# Patient Record
Sex: Male | Born: 1965 | Hispanic: No | Marital: Married | Smoking: Never smoker
Health system: Southern US, Community
[De-identification: ages and names within clinical notes are randomized; demographics above are authoritative.]

## PROBLEM LIST (undated history)

## (undated) ENCOUNTER — Ambulatory Visit: Disposition: A | Discharge status: Home / Self Care | Payer: Self-pay

## (undated) DIAGNOSIS — I10 Essential (primary) hypertension: Secondary | ICD-10-CM

## (undated) HISTORY — DX: Essential (primary) hypertension: I10

## (undated) HISTORY — PX: NO PAST SURGERIES: SHX2092

---

## 2011-02-08 ENCOUNTER — Ambulatory Visit (INDEPENDENT_AMBULATORY_CARE_PROVIDER_SITE_OTHER): Payer: PRIVATE HEALTH INSURANCE

## 2011-02-08 DIAGNOSIS — J40 Bronchitis, not specified as acute or chronic: Secondary | ICD-10-CM

## 2011-02-08 DIAGNOSIS — R05 Cough: Secondary | ICD-10-CM

## 2011-02-08 DIAGNOSIS — R5383 Other fatigue: Secondary | ICD-10-CM

## 2011-05-07 ENCOUNTER — Other Ambulatory Visit: Payer: Self-pay | Admitting: Family Medicine

## 2011-05-31 ENCOUNTER — Ambulatory Visit (INDEPENDENT_AMBULATORY_CARE_PROVIDER_SITE_OTHER): Payer: PRIVATE HEALTH INSURANCE | Admitting: Physician Assistant

## 2011-05-31 VITALS — BP 156/93 | HR 62 | Temp 98.1°F | Resp 18 | Ht 69.25 in | Wt 180.6 lb

## 2011-05-31 DIAGNOSIS — J309 Allergic rhinitis, unspecified: Secondary | ICD-10-CM | POA: Insufficient documentation

## 2011-05-31 DIAGNOSIS — I10 Essential (primary) hypertension: Secondary | ICD-10-CM | POA: Insufficient documentation

## 2011-05-31 MED ORDER — LOSARTAN POTASSIUM 50 MG PO TABS: 50.0000 mg | ORAL_TABLET | Freq: Every day | ORAL | Status: DC

## 2011-05-31 NOTE — Progress Notes (Signed)
Patient ID: Christopher Case MRN: [id number], DOB: [birth year], 46 y.o. Date of Encounter: 05/31/2011, 6:47 PM  Primary Physician: No primary provider on file.  Chief Complaint: HTN  HPI: 46 y.o. year old male with history below presents for hypertension follow up. Has been out of his Losartan 50 mg for about two months. He was tolerating his medication daily without issue when he did have it. He was not checking it at home however. Today he requests a refill of his Losartan. No CP, HA, vision changes, palpitations, edema, or focal deficits. Eating healthy at home. No time for exercise at outside of work, although he is very active at work. He is otherwise doing well and without issues or complaints today.    Past Medical History  Diagnosis Date  . HTN (hypertension)   . Allergic rhinitis      Home Meds: Prior to Admission medications   Not on File    Allergies: No Known Allergies  History   Social History  . Marital Status: Married    Spouse Name: N/A    Number of Children: N/A  . Years of Education: N/A   Occupational History  . Not on file.   Social History Main Topics  . Smoking status: Never Smoker   . Smokeless tobacco: Not on file  . Alcohol Use: No  . Drug Use: No  . Sexually Active: Yes   Other Topics Concern  . Not on file   Social History Narrative  . No narrative on file     No family history on file.  Review of Systems: Constitutional: negative for chills, fever, night sweats, weight changes, or fatigue  HEENT: negative for vision changes, hearing loss, congestion, rhinorrhea, ST, epistaxis, or sinus pressure Cardiovascular: negative for chest pain, palpitations, or DOE Respiratory: negative for hemoptysis, wheezing, shortness of breath, or cough Abdominal: negative for abdominal pain, nausea, vomiting, diarrhea, or constipation Dermatological: negative for rash Neurologic: negative for headache, dizziness, or syncope All other systems reviewed  and are otherwise negative with the exception to those above and in the HPI.   Physical Exam: Blood pressure 156/93, pulse 62, temperature 98.1 F (36.7 C), temperature source Oral, resp. rate 18, height 5' 9.25" (1.759 m), weight 180 lb 9.6 oz (81.92 kg)., Body mass index is 26.48 kg/(m^2). General: Well developed, well nourished, in no acute distress. Head: Normocephalic, atraumatic, eyes without discharge, sclera non-icteric, nares are without discharge. Bilateral auditory canals clear, TM's are without perforation, pearly Case and translucent with reflective cone of light bilaterally. Oral cavity moist, posterior pharynx without exudate, erythema, peritonsillar abscess, or post nasal drip.  Neck: Supple. No thyromegaly. Full ROM. No lymphadenopathy. No carotid bruits. Lungs: Clear bilaterally to auscultation without wheezes, rales, or rhonchi. Breathing is unlabored. Heart: RRR with S1 S2. No murmurs, rubs, or gallops appreciated.  Msk:  Strength and tone normal for age. Extremities/Skin: Warm and dry. No clubbing or cyanosis. No edema. No rashes or suspicious lesions. Distal pulses 2+ and equal bilaterally. Neuro: Alert and oriented X 3. Moves all extremities spontaneously. Gait is normal. CNII-XII grossly in tact. DTR 2+, cerebellar function intact. Rhomberg normal. Psych:  Responds to questions appropriately with a normal affect.   Labs:  BMP pending  ASSESSMENT AND PLAN:  46 y.o. year old male with HTN -Restart Losartan 50 mg #30 1 po daily RF 4 -Recheck 3 months, before he runs out of medication, to better determine titration of medication -Await labs   Signed, Eula Listen, PA-C  05/31/2011 6:47 PM

## 2011-06-01 LAB — BASIC METABOLIC PANEL
CO2: 24 mEq/L (ref 19–32)
Chloride: 105 mEq/L (ref 96–112)
Potassium: 4.6 mEq/L (ref 3.5–5.3)
Sodium: 135 mEq/L (ref 135–145)

## 2011-09-08 ENCOUNTER — Ambulatory Visit (INDEPENDENT_AMBULATORY_CARE_PROVIDER_SITE_OTHER): Payer: PRIVATE HEALTH INSURANCE | Admitting: Emergency Medicine

## 2011-09-08 VITALS — BP 152/75 | HR 75 | Temp 98.4°F | Resp 16 | Ht 69.0 in | Wt 170.0 lb

## 2011-09-08 DIAGNOSIS — T300 Burn of unspecified body region, unspecified degree: Secondary | ICD-10-CM

## 2011-09-08 DIAGNOSIS — Z23 Encounter for immunization: Secondary | ICD-10-CM

## 2011-09-08 DIAGNOSIS — I10 Essential (primary) hypertension: Secondary | ICD-10-CM

## 2011-09-08 MED ORDER — LOSARTAN POTASSIUM-HCTZ 50-12.5 MG PO TABS: 1.0000 | ORAL_TABLET | Freq: Every day | ORAL | Status: DC

## 2011-09-08 MED ORDER — SILVER SULFADIAZINE 1 % EX CREA: TOPICAL_CREAM | Freq: Every day | CUTANEOUS | Status: DC
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
## 2011-09-08 NOTE — Patient Instructions (Addendum)

## 2012-01-05 ENCOUNTER — Other Ambulatory Visit: Payer: Self-pay | Admitting: Physician Assistant

## 2012-01-07 ENCOUNTER — Ambulatory Visit (INDEPENDENT_AMBULATORY_CARE_PROVIDER_SITE_OTHER): Payer: PRIVATE HEALTH INSURANCE | Admitting: Physician Assistant

## 2012-01-07 VITALS — BP 140/90 | HR 69 | Temp 98.2°F | Resp 18 | Ht 74.0 in | Wt 175.4 lb

## 2012-01-07 DIAGNOSIS — I10 Essential (primary) hypertension: Secondary | ICD-10-CM

## 2012-01-07 NOTE — Progress Notes (Signed)
  Subjective:    Patient ID: Christopher Case, male    DOB: 08/14/1965, 46 y.o.   MRN: [id number]  HPI 46 year old male presents for recheck of blood pressure.  He has been taking Hyzaar 50/12.5 mg daily.  He has been taking daily without any noteable adverse effects.  States blood pressure has continued to be elevated.  No chest pain, SOB, headache, vision changes.  No family history of hypertension.      Review of Systems  All other systems reviewed and are negative.       Objective:   Physical Exam  Constitutional: He is oriented to person, place, and time. He appears well-developed and well-nourished.  HENT:  Head: Normocephalic and atraumatic.  Right Ear: External ear normal.  Left Ear: External ear normal.  Eyes: Conjunctivae normal and EOM are normal.  Neck: Normal range of motion. Neck supple.  Cardiovascular: Normal rate, regular rhythm and normal heart sounds.   Pulmonary/Chest: Effort normal and breath sounds normal.  Musculoskeletal: Normal range of motion.  Neurological: He is alert and oriented to person, place, and time.  Psychiatric: He has a normal mood and affect. His behavior is normal. Judgment and thought content normal.          Assessment & Plan:   1. Hypertension   Increase to Hyzaar 100/25 mg daily Recheck in 4-6 weeks.  Recommend CPE in next 2-3 months

## 2012-01-10 ENCOUNTER — Telehealth: Payer: Self-pay

## 2012-01-10 ENCOUNTER — Other Ambulatory Visit: Payer: Self-pay | Admitting: Physician Assistant

## 2012-01-10 MED ORDER — LOSARTAN POTASSIUM-HCTZ 100-25 MG PO TABS: 1.0000 | ORAL_TABLET | Freq: Every day | ORAL | Status: DC

## 2012-01-10 NOTE — Telephone Encounter (Signed)
Patient states prescriptions were never sent to the pharmacy from when he was seen Monday.  [id number] or [phone]

## 2012-01-10 NOTE — Telephone Encounter (Signed)
Pt's wife called stated pt seen on Monday and pt's prescription for BP has not been called into pharmacy yet.  Please let them know when done. [facility] [address]

## 2012-01-11 NOTE — Telephone Encounter (Signed)
I sent to pharmacy yesterday/ called to advise.

## 2012-04-07 ENCOUNTER — Other Ambulatory Visit: Payer: Self-pay | Admitting: Physician Assistant

## 2012-06-08 ENCOUNTER — Other Ambulatory Visit: Payer: Self-pay | Admitting: Physician Assistant

## 2012-06-10 ENCOUNTER — Ambulatory Visit (INDEPENDENT_AMBULATORY_CARE_PROVIDER_SITE_OTHER): Payer: PRIVATE HEALTH INSURANCE | Admitting: Family Medicine

## 2012-06-10 VITALS — BP 131/79 | HR 70 | Temp 98.5°F | Resp 17 | Ht 70.5 in | Wt 181.0 lb

## 2012-06-10 DIAGNOSIS — I10 Essential (primary) hypertension: Secondary | ICD-10-CM

## 2012-06-10 MED ORDER — LOSARTAN POTASSIUM-HCTZ 100-25 MG PO TABS: 1.0000 | ORAL_TABLET | Freq: Every day | ORAL | Status: DC

## 2012-06-10 NOTE — Patient Instructions (Addendum)
Thank you for coming in today. Take your medications daily We will call if your labs are abnormal.  Return in one year or sooner for followup.

## 2012-06-10 NOTE — Progress Notes (Signed)
Christopher Case is a 47 y.o. male who presents to [facility] today for hypertension followup.  Patient is previously well controlled with losartan hydrochlorothiazide. He is doing well and is currently asymptomatic with his hypertension. He denies any chest pains palpitations shortness of breath or syncope. He feels well and is able to go to work and lead a normal life.    PMH: Reviewed htn  History  Substance Use Topics  . Smoking status: Never Smoker   . Smokeless tobacco: Never Used  . Alcohol Use: No   ROS as above  Medications reviewed. Current Outpatient Prescriptions  Medication Sig Dispense Refill  . losartan-hydrochlorothiazide (HYZAAR) 100-25 MG per tablet Take 1 tablet by mouth daily.  90 tablet  3   No current facility-administered medications for this visit.    Exam:  BP 131/79  Pulse 70  Temp(Src) 98.5 F (36.9 C) (Oral)  Resp 17  Ht 5' 10.5" (1.791 m)  Wt 181 lb (82.101 kg)  BMI 25.6 kg/m2  SpO2 99% Gen: Well NAD HEENT: EOMI,  MMM Lungs: CTABL Nl WOB Heart: RRR no MRG Abd: NABS, NT, ND Exts: Non edematous BL  LE, warm and well perfused.   No results found for this or any previous visit (from the past 72 hour(s)).  Assessment and Plan: 47 y.o. male with hypertension currently well-controlled. Plan to refill with certain hydrochlorothiazide. Additionally we'll obtain a comprehensive metabolic panel and direct LDL. Patient will followup in one year for refills.

## 2012-06-11 LAB — LDL CHOLESTEROL, DIRECT: Direct LDL: 110 mg/dL — ABNORMAL HIGH

## 2012-06-11 LAB — COMPLETE METABOLIC PANEL WITH GFR
Alkaline Phosphatase: 72 U/L (ref 39–117)
BUN: 17 mg/dL (ref 6–23)
CO2: 26 mEq/L (ref 19–32)
Creat: 1.06 mg/dL (ref 0.50–1.35)
GFR, Est African American: >89 mL/min
GFR, Est Non African American: 84 mL/min
Glucose, Bld: 77 mg/dL (ref 70–99)
Total Bilirubin: 0.5 mg/dL (ref 0.3–1.2)

## 2013-09-02 ENCOUNTER — Ambulatory Visit (INDEPENDENT_AMBULATORY_CARE_PROVIDER_SITE_OTHER): Payer: PRIVATE HEALTH INSURANCE | Admitting: Family Medicine

## 2013-09-02 VITALS — BP 144/76 | HR 66 | Temp 98.1°F | Resp 16 | Ht 70.5 in | Wt 177.8 lb

## 2013-09-02 DIAGNOSIS — I10 Essential (primary) hypertension: Secondary | ICD-10-CM

## 2013-09-02 MED ORDER — LOSARTAN POTASSIUM-HCTZ 100-25 MG PO TABS: 1.0000 | ORAL_TABLET | Freq: Every day | ORAL | Status: DC

## 2013-09-02 NOTE — Patient Instructions (Addendum)
Work on diet and exercise  Breathing exercises

## 2013-09-02 NOTE — Progress Notes (Signed)
  Christopher Case - 48 y.o. male MRN [id number]  Date of birth: 1965/09/08  SUBJECTIVE:  Including CC & ROS.   Patient is a 48 year old male who presents today for followup on his hypertension. According to the patient's record he has been on medication since 2013. And has consistently been taking losartan 100 mg HCTZ 25 mg for the past 2 years. He did have some clinical improvement from systolics in the 170s down the 140s previously but  has not made progress in the lower than 140. For the past month patient been off medication. He denies any changes in diet has been watching his salt content eats most of his meals at home. He does not perform any exercise other than walking at work. Patient's had a normal healthy weight for his height. Denies any chest pain, shortness of breath, syncope or dizziness. Denies tobacco use, denies alcohol use.  ROS:  Constitutional:  No  fatigue.  Respiratory:  No shortness of breath, cough, or wheezing Cardiovascular:  No palpitations, chest pain or syncope Review of systems otherwise negative except for what is stated in HPI  HISTORY: Past Medical, Surgical, Social, and Family History Reviewed & Updated per EMR. Pertinent Historical Findings include: Nonsmoker, no alcohol use, family history is significant for HTN  PHYSICAL EXAM:  VS: BP:144/76 mmHg  HR:66bpm  TEMP:98.1 F (36.7 C)(Oral)  RESP:98 %  HT:5' 10.5" (179.1 cm)   WT:177 lb 12.8 oz (80.65 kg)  BMI:25.2 PHYSICAL EXAM: General:  Alert and oriented, No acute distress.   HENT:  Normocephalic, Oral mucosa is moist.   Respiratory:  Lungs are clear to auscultation, Respirations are non-labored, Symmetrical chest wall expansion.   Cardiovascular:  Normal rate, Regular rhythm, No murmur, Good pulses equal in all extremities, No edema.   Gastrointestinal:  Soft, Non-tender, Non-distended, Normal bowel sounds, No organomegaly.   Integumentary:  Warm, Dry, No rash.   Neurologic:  Alert, Oriented, No focal  defects Psychiatric:  Cooperative, Appropriate mood & affect.    ASSESSMENT & PLAN:  Essential hypertension: Patient has had elevated blood pressures persistency for the past 2 years. He's consistently been on losartan 100 mg/HCTZ 25 mg for the past 2 years with occasionally missing dose from running out of medication. Although his blood pressure-improved the 140s he has not made much progress since.  According to the patient his lifestyle modification include low-salt diet and exercise. Advised patient to continue to work on these as well as stress management with breathing exercises and meditation. Plan continue current dose of this medication recommend followup in office in 2 months to further discuss possible additional medication. Would recommend trying adding amlodipine 5 mg or 10mg . We'll evaluating patient with a BMP today to rule out any changes in kidney function LDL in 2014 : 110 Lab Results  Component Value Date   CREATININE 1.06 06/10/2012   CREATININE 1.04 05/31/2011

## 2013-09-03 LAB — BASIC METABOLIC PANEL
BUN: 17 mg/dL (ref 6–23)
CALCIUM: 9.3 mg/dL (ref 8.4–10.5)
CO2: 26 meq/L (ref 19–32)
CREATININE: 1.1 mg/dL (ref 0.50–1.35)
Chloride: 105 mEq/L (ref 96–112)
GLUCOSE: 94 mg/dL (ref 70–99)
Potassium: 4.5 mEq/L (ref 3.5–5.3)
SODIUM: 140 meq/L (ref 135–145)

## 2013-09-03 NOTE — Progress Notes (Signed)
Tried to leave message for patient to schedule appt with Dr. Neva SeatGreene for 2 month f/up but VM was not working properly.

## 2013-09-04 NOTE — Progress Notes (Signed)
Ppatient discussed with Dr. Tammy Soursidiano. Agree with assessment and plan of care per her note.

## 2013-09-08 ENCOUNTER — Encounter: Payer: Self-pay | Admitting: Radiology

## 2013-09-15 NOTE — Progress Notes (Signed)
Left a message for patient to return call to schedule two month follow up appointment with Dr. Neva SeatGreene for hypertension.

## 2013-09-17 NOTE — Progress Notes (Signed)
Left a message for patient to return call to schedule a 2 month follow up visit with Dr. Neva SeatGreene for hypertension.

## 2013-09-25 NOTE — Progress Notes (Signed)
Appointment scheduled for 10/19 at 345 with Dr. Neva SeatGreene

## 2013-11-23 ENCOUNTER — Ambulatory Visit: Payer: Self-pay | Admitting: Family Medicine

## 2014-03-11 ENCOUNTER — Ambulatory Visit (INDEPENDENT_AMBULATORY_CARE_PROVIDER_SITE_OTHER): Payer: PRIVATE HEALTH INSURANCE | Admitting: Sports Medicine

## 2014-03-11 VITALS — BP 142/68 | HR 77 | Temp 98.6°F | Resp 16 | Ht 70.0 in | Wt 177.0 lb

## 2014-03-11 DIAGNOSIS — I10 Essential (primary) hypertension: Secondary | ICD-10-CM

## 2014-03-11 MED ORDER — HYDROCHLOROTHIAZIDE 50 MG PO TABS: 50.0000 mg | ORAL_TABLET | Freq: Every day | ORAL | Status: DC

## 2014-03-11 MED ORDER — LOSARTAN POTASSIUM 100 MG PO TABS: 100.0000 mg | ORAL_TABLET | Freq: Every day | ORAL | Status: DC

## 2014-03-11 NOTE — Progress Notes (Signed)
History and physical examinations reviewed in detail. Agree with assessment and plan as outlined by Dr. Didiano. Fermon Ureta Martin Lottie Sigman, M.D. [facility]  [place] [address] [place], [place]  [address] [phone] phone [phone] fax  

## 2014-03-11 NOTE — Addendum Note (Signed)
Addended by: Ethelda ChickSMITH, Filipe Greathouse M on: 03/11/2014 11:32 PM   Modules accepted: Level of Service

## 2014-03-11 NOTE — Progress Notes (Signed)
  Christopher Case - 49 y.o. male MRN [id number]  Date of birth: 15-May-1965  SUBJECTIVE:  Including CC & ROS.   Patient is a [age] year old male who presents today for followup on his hypertension. According to the patient's record he has been on medication since 2013. And has consistently been taking losartan 100 mg HCTZ 25 mg for the past 2 years. Home BP in 140s over 60s. For the past month patient been off medication after running out. He denies any changes in diet has been watching his salt content eats most of his meals at home. He does not perform any exercise other than walking at work. Patient's had a normal healthy weight for his height. Denies any chest pain, shortness of breath, syncope or dizziness. Denies tobacco use, denies alcohol use.  ROS:  Constitutional:  No  fatigue.  Respiratory:  No shortness of breath, cough, or wheezing Cardiovascular:  No palpitations, chest pain or syncope Review of systems otherwise negative except for what is stated in HPI  HISTORY: Past Medical, Surgical, Social, and Family History Reviewed & Updated per EMR. Pertinent Historical Findings include: Nonsmoker, no alcohol use, family history is significant for HTN  PHYSICAL EXAM:  VS: BP:(!) 142/68 mmHg  HR:77bpm  TEMP:98.6 F (37 C)(Oral)  RESP:98 %  HT:5\' 10"  (177.8 cm)   WT:177 lb (80.287 kg)  BMI:25.4 PHYSICAL EXAM: General:  Alert and oriented, No acute distress.   HENT:  Normocephalic, Oral mucosa is moist.   Respiratory:  Lungs are clear to auscultation, Respirations are non-labored, Symmetrical chest wall expansion.   Cardiovascular:  Normal rate, Regular rhythm, No murmur, Good pulses equal in all extremities, No edema.   Gastrointestinal:  Soft, Non-tender, Non-distended, Normal bowel sounds, No organomegaly.   Integumentary:  Warm, Dry, No rash.   Neurologic:  Alert, Oriented, No focal defects Psychiatric:  Cooperative, Appropriate mood & affect.    ASSESSMENT & PLAN:  Essential  hypertension: Patient presents today for follow-up on essential hypertension. Continues to have elevated blood pressures in the 140s over 60s to 70s. Recommend small titration up on his current blood pressure medication. Patient was agreeable this plan. Patient sent a new prescription for losartan 100 mg to be taken daily as well as HCTZ 50 mg to be taken daily. Lab work will be checked in 2 weeks to determine his kidney function is tolerating changes medication.  Advised patient to follow-up in 2 weeks for lab work and blood pressure check. Lab work is artery been ordered in future date.  Lab Results  Component Value Date   CREATININE 1.10 09/02/2013   CREATININE 1.06 06/10/2012   CREATININE 1.04 05/31/2011

## 2014-12-21 ENCOUNTER — Other Ambulatory Visit: Payer: Self-pay | Admitting: Sports Medicine

## 2015-03-25 ENCOUNTER — Ambulatory Visit (INDEPENDENT_AMBULATORY_CARE_PROVIDER_SITE_OTHER): Payer: PRIVATE HEALTH INSURANCE | Admitting: Physician Assistant

## 2015-03-25 VITALS — BP 130/82 | HR 63 | Temp 98.1°F | Resp 18 | Ht 70.0 in | Wt 179.4 lb

## 2015-03-25 DIAGNOSIS — I10 Essential (primary) hypertension: Secondary | ICD-10-CM | POA: Diagnosis not present

## 2015-03-25 DIAGNOSIS — R21 Rash and other nonspecific skin eruption: Secondary | ICD-10-CM

## 2015-03-25 LAB — BASIC METABOLIC PANEL
BUN: 19 mg/dL (ref 7–25)
CO2: 24 mmol/L (ref 20–31)
CREATININE: 0.95 mg/dL (ref 0.60–1.35)
Calcium: 8.7 mg/dL (ref 8.6–10.3)
Chloride: 107 mmol/L (ref 98–110)
Glucose, Bld: 89 mg/dL (ref 65–99)
Potassium: 4.2 mmol/L (ref 3.5–5.3)
Sodium: 138 mmol/L (ref 135–146)

## 2015-03-25 MED ORDER — LOSARTAN POTASSIUM 100 MG PO TABS: 100.0000 mg | ORAL_TABLET | Freq: Every day | ORAL | Status: DC

## 2015-03-25 MED ORDER — HYDROCORTISONE 2.5 % EX CREA: TOPICAL_CREAM | Freq: Two times a day (BID) | CUTANEOUS | Status: DC

## 2015-03-25 MED ORDER — HYDROCHLOROTHIAZIDE 50 MG PO TABS: 50.0000 mg | ORAL_TABLET | Freq: Every day | ORAL | Status: DC

## 2015-03-25 MED ORDER — HYDROXYZINE HCL 25 MG PO TABS: 12.5000 mg | ORAL_TABLET | Freq: Three times a day (TID) | ORAL | Status: DC | PRN

## 2015-03-25 NOTE — Progress Notes (Signed)
   Subjective:    Patient ID: Christopher Case, male    DOB: 08/11/1965, 50 y.o.   MRN: [id number]  Chief Complaint  Patient presents with  . Blood Pressure Check  . Rash    Itchy, arms and legs  . Medication Refill    hctz   Medications, allergies, past medical history, surgical history, family history, social history and problem list reviewed and updated.  HPI  50 yom. Needing bp meds refilled, concerned about bp as ran out of hctz last week.   Hx htn. Has been on hctz and losartan stable doses for several years. Checks bp occasionally, runs <140/80. Denies cp or sob. Does not do formal exercise but very active at work.   Takes meds as prescribed. Ran out of hctz last week so concerned bp might be high.   Also mentioning intermittent diffuse itchy bumps that come on for several days at a time. Past few months. Few currently. Thinks he is allergic to certain foods but unsure what at this point.   Review of Systems No fevers, chills. No abd pain, n/v, diarrhea.     Objective:   Physical Exam  Constitutional: He appears well-developed and well-nourished.  Non-toxic appearance. He does not have a sickly appearance. He does not appear ill. No distress.  BP 130/82 mmHg  Pulse 63  Temp(Src) 98.1 F (36.7 C) (Oral)  Resp 18  Ht  (1.778 m)  Wt 179 lb 6.4 oz (81.375 kg)  BMI 25.74 kg/m2  SpO2 98%   Cardiovascular: Normal rate, regular rhythm and normal heart sounds.   Pulmonary/Chest: Effort normal and breath sounds normal. No tachypnea.  Skin:  Occasional scattered papules on chest, bilateral arms. No underlying erythema. No tracking. No purulence or induration.       Assessment & Plan:   Essential hypertension - Plan: hydrochlorothiazide (HYDRODIURIL) 50 MG tablet, losartan (COZAAR) 100 MG tablet, Basic metabolic panel  Rash and nonspecific skin eruption - Plan: hydrOXYzine (ATARAX/VISTARIL) 25 MG tablet, hydrocortisone 2.5 % cream --bp stable today, refilled meds one  year, checking bmp today --encouraged to establish with pcp and get complete physical in one year at time of refill --hydroxyzine, steroid cream for intermittent dermatitis  Donnajean Lopes, PA-C Physician Assistant-Certified [facility]  03/26/2015 5:59 AM

## 2015-03-25 NOTE — Patient Instructions (Signed)
I've refilled the losartan and hctz for one year.  Continue to work on limiting the sodium and sugar intake like you are. We're checking your kidneys today to ensure they are ok with the medicines and high blood pressure.  I recommend you get a PCP here and get a yearly physical the next time you get your medicines refilled. For the rash and itching, apply the topical and take the medicine as needed.   Hypertension Hypertension, commonly called high blood pressure, is when the force of blood pumping through your arteries is too strong. Your arteries are the blood vessels that carry blood from your heart throughout your body. A blood pressure reading consists of a higher number over a lower number, such as 110/72. The higher number (systolic) is the pressure inside your arteries when your heart pumps. The lower number (diastolic) is the pressure inside your arteries when your heart relaxes. Ideally you want your blood pressure below 120/80. Hypertension forces your heart to work harder to pump blood. Your arteries may become narrow or stiff. Having untreated or uncontrolled hypertension can cause heart attack, stroke, kidney disease, and other problems. RISK FACTORS Some risk factors for high blood pressure are controllable. Others are not.  Risk factors you cannot control include:   Race. You may be at higher risk if you are African American.  Age. Risk increases with age.  Gender. Men are at higher risk than women before age 46 years. After age 61, women are at higher risk than men. Risk factors you can control include:  Not getting enough exercise or physical activity.  Being overweight.  Getting too much fat, sugar, calories, or salt in your diet.  Drinking too much alcohol. SIGNS AND SYMPTOMS Hypertension does not usually cause signs or symptoms. Extremely high blood pressure (hypertensive crisis) may cause headache, anxiety, shortness of breath, and nosebleed. DIAGNOSIS To check if  you have hypertension, your health care provider will measure your blood pressure while you are seated, with your arm held at the level of your heart. It should be measured at least twice using the same arm. Certain conditions can cause a difference in blood pressure between your right and left arms. A blood pressure reading that is higher than normal on one occasion does not mean that you need treatment. If it is not clear whether you have high blood pressure, you may be asked to return on a different day to have your blood pressure checked again. Or, you may be asked to monitor your blood pressure at home for 1 or more weeks. TREATMENT Treating high blood pressure includes making lifestyle changes and possibly taking medicine. Living a healthy lifestyle can help lower high blood pressure. You may need to change some of your habits. Lifestyle changes may include:  Following the DASH diet. This diet is high in fruits, vegetables, and whole grains. It is low in salt, red meat, and added sugars.  Keep your sodium intake below 2,300 mg per day.  Getting at least 30-45 minutes of aerobic exercise at least 4 times per week.  Losing weight if necessary.  Not smoking.  Limiting alcoholic beverages.  Learning ways to reduce stress. Your health care provider may prescribe medicine if lifestyle changes are not enough to get your blood pressure under control, and if one of the following is true:  You are 68-75 years of age and your systolic blood pressure is above 140.  You are 1 years of age or older, and your systolic  blood pressure is above 150.  Your diastolic blood pressure is above 90.  You have diabetes, and your systolic blood pressure is over 140 or your diastolic blood pressure is over 90.  You have kidney disease and your blood pressure is above 140/90.  You have heart disease and your blood pressure is above 140/90. Your personal target blood pressure may vary depending on your  medical conditions, your age, and other factors. HOME CARE INSTRUCTIONS  Have your blood pressure rechecked as directed by your health care provider.   Take medicines only as directed by your health care provider. Follow the directions carefully. Blood pressure medicines must be taken as prescribed. The medicine does not work as well when you skip doses. Skipping doses also puts you at risk for problems.  Do not smoke.   Monitor your blood pressure at home as directed by your health care provider. SEEK MEDICAL CARE IF:   You think you are having a reaction to medicines taken.  You have recurrent headaches or feel dizzy.  You have swelling in your ankles.  You have trouble with your vision. SEEK IMMEDIATE MEDICAL CARE IF:  You develop a severe headache or confusion.  You have unusual weakness, numbness, or feel faint.  You have severe chest or abdominal pain.  You vomit repeatedly.  You have trouble breathing. MAKE SURE YOU:   Understand these instructions.  Will watch your condition.  Will get help right away if you are not doing well or get worse.   This information is not intended to replace advice given to you by your health care provider. Make sure you discuss any questions you have with your health care provider.   Document Released: 01/22/2005 Document Revised: 06/08/2014 Document Reviewed: 11/14/2012 Elsevier Interactive Patient Education [employer]

## 2015-03-26 ENCOUNTER — Encounter: Payer: Self-pay | Admitting: Physician Assistant

## 2015-05-13 ENCOUNTER — Encounter: Payer: Self-pay | Admitting: Allergy and Immunology

## 2015-05-13 ENCOUNTER — Ambulatory Visit (INDEPENDENT_AMBULATORY_CARE_PROVIDER_SITE_OTHER): Payer: PRIVATE HEALTH INSURANCE | Admitting: Allergy and Immunology

## 2015-05-13 VITALS — BP 140/80 | HR 75 | Temp 98.1°F | Resp 17 | Ht 69.69 in | Wt 178.6 lb

## 2015-05-13 DIAGNOSIS — R05 Cough: Secondary | ICD-10-CM | POA: Diagnosis not present

## 2015-05-13 DIAGNOSIS — J309 Allergic rhinitis, unspecified: Secondary | ICD-10-CM

## 2015-05-13 DIAGNOSIS — H1010 Acute atopic conjunctivitis, unspecified eye: Secondary | ICD-10-CM

## 2015-05-13 DIAGNOSIS — R059 Cough, unspecified: Secondary | ICD-10-CM

## 2015-05-13 MED ORDER — BEPOTASTINE BESILATE 1.5 % OP SOLN: OPHTHALMIC | Status: DC

## 2015-05-13 MED ORDER — AZELASTINE-FLUTICASONE 137-50 MCG/ACT NA SUSP: NASAL | Status: DC

## 2015-05-13 MED ORDER — MONTELUKAST SODIUM 10 MG PO TABS: ORAL_TABLET | ORAL | Status: DC

## 2015-05-13 NOTE — Progress Notes (Signed)
FOLLOW UP NOTE  RE: Marino Rogerson MRN: [id number] DOB: 05/24/1965 [facility] [address] [place] [address] Date of Office Visit: 05/13/2015  Subjective:  Christopher Case is a 50 y.o. male who presents today for Nasal Congestion and Pressure Behind the Eyes  Assessment:   1. Allergic rhinoconjunctivitis   2. Cough    Plan:   Meds ordered this encounter  Medications  . Azelastine-Fluticasone 137-50 MCG/ACT SUSP    Sig: Use one spray in each nostril twice daily.    Dispense:  1 Bottle    Refill:  5  . Bepotastine Besilate (BEPREVE) 1.5 % SOLN    Sig: Apply one drop in each eye twice daily as needed for itchy eyes.    Dispense:  1 Bottle    Refill:  5  . montelukast (SINGULAIR) 10 MG tablet    Sig: Take one tablet daily in the evening to prevent cough or wheeze.    Dispense:  30 tablet    Refill:  5   1. Avoidance: Pollen--reviewed various modalities for minimizing exposure etc. 2. Antihistamine: Zyrtec   by mouth once daily for runny nose or itching. 3. Nasal Spray: Dymista one spray(s) each nostril twice daily for stuffy nose or drainage.  4. Eye Drops: Bepreve one drop(s) each eye twice daily for itchy eyes. 5. Other: Restart Singulair  each evening. 6. Nasal Saline wash each evening at shower time. 7. Follow up Visit: 2-3 months or sooner if needed.  HPI: Christopher Case returns to the office with one week history of recurring symptoms though he has not been seen since April 2015.  He had decided to discontinue immunotherapy.  His recent symptoms include congestion, sneezing, itchy watery eyes, intermittent postnasal drip, and his wife reports noted mouth breathing during sleep.  He does not snore and denies any difficulty breathing, shortness of breath, sore throat, discolored drainage, headache or fever.  Currently he feels the cough is rare and mostly associated with postnasal drip.  He is requesting medication refills as they  previously have been beneficial.  Denies ED or urgent care visits, prednisone or antibiotic courses. Reports sleep and activity are normal.  No other new medical medical problems.  Continues to follow his primary regarding blood pressure and had seen dermatology related to hand eczema.  [facility] has a current medication list which includes the following prescription(s): cetirizine, hydrochlorothiazide, losartan.  Drug Allergies: No Known Allergies  Objective:   Filed Vitals:   05/13/15 1027  BP: 140/80  Pulse: 75  Temp: 98.1 F (36.7 C)  Resp: 17   SpO2 Readings from Last 1 Encounters:  05/13/15 98%   Physical Exam  Constitutional: He is well-developed, well-nourished, and in no distress.  Intermittent sniffling.  HENT:  Head: Atraumatic.  Right Ear: Tympanic membrane and ear canal normal.  Left Ear: Tympanic membrane and ear canal normal.  Nose: Mucosal edema present. No rhinorrhea. No epistaxis.  Mouth/Throat: Oropharynx is clear and moist and mucous membranes are normal. No oropharyngeal exudate, posterior oropharyngeal edema or posterior oropharyngeal erythema.  Eyes: Conjunctivae are normal.  Neck: Neck supple.  Cardiovascular: Normal rate, S1 normal and S2 normal.   No murmur heard. Pulmonary/Chest: Effort normal and breath sounds normal. He has no wheezes. He has no rhonchi. He has no rales.  Lymphadenopathy:    He has no cervical adenopathy.  Skin: Skin is warm and intact. No rash noted. No cyanosis. Nails show no clubbing.   Diagnostics: Spirometry:  FVC 4.72--104%, FEV1 3.67--99%.    Joane Postel M. Willa RoughHicks, MD  cc: No PCP Per Patient

## 2015-05-13 NOTE — Patient Instructions (Addendum)
  Take Home Sheet  1. Avoidance: Pollen   2. Antihistamine: Zyrtec 10mg   by mouth once daily for runny nose or itching.   3. Nasal Spray: Dymista one spray(s) each nostril twice daily for stuffy nose or drainage.     4. Eye Drops: Bepreve one drop(s) each eye twice daily for itchy eyes.   5. Other: Restart Singulair 10mg  each evening.   6. Nasal Saline wash each evening at shower time.   7. Follow up Visit: 2-3 months or sooner if needed.   Websites that have reliable Patient information: 1. American Academy of Asthma, Allergy, & Immunology: www.aaaai.org 2. Food Allergy Network: www.foodallergy.org 3. Mothers of Asthmatics: www.aanma.org 4. [facility]: https://www.strong.com/www.njc.org 5. American College of Allergy, Asthma, & Immunology: BiggerRewards.iswww.allergy.[employer].edu or www.acaai.org

## 2016-03-16 ENCOUNTER — Ambulatory Visit (INDEPENDENT_AMBULATORY_CARE_PROVIDER_SITE_OTHER): Payer: PRIVATE HEALTH INSURANCE | Admitting: Emergency Medicine

## 2016-03-16 ENCOUNTER — Ambulatory Visit (INDEPENDENT_AMBULATORY_CARE_PROVIDER_SITE_OTHER): Payer: PRIVATE HEALTH INSURANCE

## 2016-03-16 VITALS — BP 144/66 | HR 80 | Temp 98.1°F | Ht 69.69 in | Wt 184.8 lb

## 2016-03-16 DIAGNOSIS — S20211A Contusion of right front wall of thorax, initial encounter: Secondary | ICD-10-CM

## 2016-03-16 DIAGNOSIS — R0789 Other chest pain: Secondary | ICD-10-CM

## 2016-03-16 MED ORDER — HYDROCODONE-ACETAMINOPHEN 5-325 MG PO TABS: 1.0000 | ORAL_TABLET | Freq: Four times a day (QID) | ORAL | 0 refills | Status: DC | PRN

## 2016-03-16 NOTE — Progress Notes (Signed)
Christopher Case 51 y.o.   Chief Complaint  Patient presents with  . Chest Pain    X 1 week right side chest pain due to fall  . Shortness of Breath    X 1 week    HISTORY OF PRESENT ILLNESS: This is a 51 y.o. male complaining of right sided chest wall pain following injury 1 week ago. Also has intermittent SOB. HPI   Prior to Admission medications   Medication Sig Start Date End Date Taking? Authorizing Provider  cetirizine (ZYRTEC) 10 MG tablet Take 10 mg by mouth daily.   Yes Historical Provider, MD  losartan (COZAAR) 100 MG tablet Take 1 tablet (100 mg total) by mouth daily. 03/25/15  Yes Todd McVeigh, PA  montelukast (SINGULAIR) 10 MG tablet Take one tablet daily in the evening to prevent cough or wheeze. 05/13/15  Yes Roselyn Kara MeadM Hicks, MD  Azelastine-Fluticasone 137-50 MCG/ACT SUSP Use one spray in each nostril twice daily. Patient not taking: Reported on 03/16/2016 05/13/15   Baxter Hireoselyn M Hicks, MD  Bepotastine Besilate (BEPREVE) 1.5 % SOLN Apply one drop in each eye twice daily as needed for itchy eyes. Patient not taking: Reported on 03/16/2016 05/13/15   Baxter Hireoselyn M Hicks, MD  hydrochlorothiazide (HYDRODIURIL) 50 MG tablet Take 1 tablet (50 mg total) by mouth daily. Patient not taking: Reported on 03/16/2016 03/25/15   Raelyn Ensignodd McVeigh, PA  HYDROcodone-acetaminophen (NORCO) 5-325 MG tablet Take 1 tablet by mouth every 6 (six) hours as needed for moderate pain. 03/16/16   Georgina QuintMiguel Jose Deandre Brannan, MD  hydrocortisone 2.5 % cream Apply topically 2 (two) times daily. Patient not taking: Reported on 05/13/2015 03/25/15   Raelyn Ensignodd McVeigh, PA  hydrOXYzine (ATARAX/VISTARIL) 25 MG tablet Take 0.5-1 tablets (12.5-25 mg total) by mouth every 8 (eight) hours as needed for itching. Patient not taking: Reported on 05/13/2015 03/25/15   Raelyn Ensignodd McVeigh, PA    No Known Allergies  Patient Active Problem List   Diagnosis Date Noted  . HTN (hypertension)   . Allergic rhinitis     Past Medical History:  Diagnosis Date  .  Allergic rhinitis   . HTN (hypertension)     Past Surgical History:  Procedure Laterality Date  . NO PAST SURGERIES      Social History   Social History  . Marital status: Married    Spouse name: N/A  . Number of children: N/A  . Years of education: N/A   Occupational History  . Not on file.   Social History Main Topics  . Smoking status: Never Smoker  . Smokeless tobacco: Never Used  . Alcohol use No  . Drug use: No  . Sexual activity: Yes   Other Topics Concern  . Not on file   Social History Narrative  . No narrative on file    Family History  Problem Relation Age of Onset  . Hypertension Father   . Allergic rhinitis Neg Hx   . Angioedema Neg Hx   . Asthma Neg Hx   . Atopy Neg Hx   . Eczema Neg Hx   . Immunodeficiency Neg Hx   . Urticaria Neg Hx      Review of Systems  Constitutional: Negative for chills and fever.  HENT: Negative for congestion, nosebleeds and sore throat.   Respiratory: Positive for shortness of breath. Negative for cough.   Cardiovascular: Positive for chest pain. Negative for palpitations and leg swelling.  Gastrointestinal: Negative for abdominal pain, nausea and vomiting.  Genitourinary: Negative for hematuria.  Skin: Negative for rash.  Neurological: Negative for dizziness and headaches.  Endo/Heme/Allergies: Does not bruise/bleed easily.  All other systems reviewed and are negative.  Vitals:   03/16/16 1042  BP: (!) 144/66  Pulse: 80  Temp: 98.1 F (36.7 C)    Physical Exam  Constitutional: He is oriented to person, place, and time. He appears well-developed and well-nourished.  HENT:  Head: Normocephalic and atraumatic.  Eyes: Conjunctivae and EOM are normal. Pupils are equal, round, and reactive to light.  Neck: Normal range of motion. Neck supple.  Cardiovascular: Normal rate and regular rhythm.   Pulmonary/Chest: Effort normal and breath sounds normal. He exhibits tenderness (right lower anterio-lateral  ribcage).  Abdominal: Soft. He exhibits no distension. There is no tenderness.  Musculoskeletal: Normal range of motion.  Neurological: He is alert and oriented to person, place, and time. No sensory deficit. He exhibits normal muscle tone.  Skin: Skin is warm and dry. No rash noted.  No bruising  Vitals reviewed.  CXR reviewed: NAD, no PTX. ASSESSMENT & PLAN: Christopher Case was seen today for chest pain and shortness of breath.  Diagnoses and all orders for this visit:  Rib contusion, right, initial encounter -     DG Ribs Unilateral W/Chest Right; Future  Chest wall pain -     DG Ribs Unilateral W/Chest Right; Future  Other orders -     HYDROcodone-acetaminophen (NORCO) 5-325 MG tablet; Take 1 tablet by mouth every 6 (six) hours as needed for moderate pain.    Patient Instructions       IF you received an x-ray today, you will receive an invoice from [facility]. Please contact [facility] at [phone] with questions or concerns regarding your invoice.   IF you received labwork today, you will receive an invoice from [facility]. Please contact [facility] at [phone] with questions or concerns regarding your invoice.   Our billing staff will not be able to assist you with questions regarding bills from these companies.  You will be contacted with the lab results as soon as they are available. The fastest way to get your results is to activate your My Chart account. Instructions are located on the last page of this paperwork. If you have not heard from [place] regarding the results in 2 weeks, please contact this office.     Rib Contusion Introduction A rib contusion is a deep bruise on your rib area. Contusions are the result of a blunt trauma that causes bleeding and injury to the tissues under the skin. A rib contusion may involve bruising of the ribs and of the skin and muscles in the area. The skin overlying the contusion may turn blue, purple, or yellow. Minor  injuries will give you a painless contusion, but more severe contusions may stay painful and swollen for a few weeks. What are the causes? A contusion is usually caused by a blow, trauma, or direct force to an area of the body. This often occurs while playing contact sports. What are the signs or symptoms?  Swelling and redness of the injured area.  Discoloration of the injured area.  Tenderness and soreness of the injured area.  Pain with or without movement. How is this diagnosed? The diagnosis can be made by taking a medical history and performing a physical exam. An X-ray, CT scan, or MRI may be needed to determine if there were any associated injuries, such as broken bones (fractures) or internal injuries. How is this treated? Often, the best treatment  for a rib contusion is rest. Icing or applying cold compresses to the injured area may help reduce swelling and inflammation. Deep breathing exercises may be recommended to reduce the risk of partial lung collapse and pneumonia. Over-the-counter or prescription medicines may also be recommended for pain control. Follow these instructions at home:  Apply ice to the injured area:  Put ice in a plastic bag.  Place a towel between your skin and the bag.  Leave the ice on for 20 minutes, 2-3 times per day.  Take medicines only as directed by your health care provider.  Rest the injured area. Avoid strenuous activity and any activities or movements that cause pain. Be careful during activities and avoid bumping the injured area.  Perform deep-breathing exercises as directed by your health care provider.  Do not lift anything that is heavier than 5 lb (2.3 kg) until your health care provider approves.  Do not use any tobacco products, including cigarettes, chewing tobacco, or electronic cigarettes. If you need help quitting, ask your health care provider. Contact a health care provider if:  You have increased bruising or  swelling.  You have pain that is not controlled with treatment.  You have a fever. Get help right away if:  You have difficulty breathing or shortness of breath.  You develop a continual cough, or you cough up thick or bloody sputum.  You feel sick to your stomach (nauseous), you throw up (vomit), or you have abdominal pain. This information is not intended to replace advice given to you by your health care provider. Make sure you discuss any questions you have with your health care provider. Document Released: 10/17/2000 Document Revised: 06/30/2015 Document Reviewed: 11/03/2013  2017 Elsevier     Edwina Barth, MD [facility]

## 2016-03-16 NOTE — Patient Instructions (Addendum)
IF you received an x-ray today, you will receive an invoice from [facility]. Please contact [facility] at [phone] with questions or concerns regarding your invoice.   IF you received labwork today, you will receive an invoice from [facility]. Please contact [facility] at [phone] with questions or concerns regarding your invoice.   Our billing staff will not be able to assist you with questions regarding bills from these companies.  You will be contacted with the lab results as soon as they are available. The fastest way to get your results is to activate your My Chart account. Instructions are located on the last page of this paperwork. If you have not heard from us regarding the results in 2 weeks, please contact this office.     Rib Contusion Introduction A rib contusion is a deep bruise on your rib area. Contusions are the result of a blunt trauma that causes bleeding and injury to the tissues under the skin. A rib contusion may involve bruising of the ribs and of the skin and muscles in the area. The skin overlying the contusion may turn blue, purple, or yellow. Minor injuries will give you a painless contusion, but more severe contusions may stay painful and swollen for a few weeks. What are the causes? A contusion is usually caused by a blow, trauma, or direct force to an area of the body. This often occurs while playing contact sports. What are the signs or symptoms?  Swelling and redness of the injured area.  Discoloration of the injured area.  Tenderness and soreness of the injured area.  Pain with or without movement. How is this diagnosed? The diagnosis can be made by taking a medical history and performing a physical exam. An X-ray, CT scan, or MRI may be needed to determine if there were any associated injuries, such as broken bones (fractures) or internal injuries. How is this treated? Often, the best treatment for a rib contusion is rest.  Icing or applying cold compresses to the injured area may help reduce swelling and inflammation. Deep breathing exercises may be recommended to reduce the risk of partial lung collapse and pneumonia. Over-the-counter or prescription medicines may also be recommended for pain control. Follow these instructions at home:  Apply ice to the injured area:  Put ice in a plastic bag.  Place a towel between your skin and the bag.  Leave the ice on for 20 minutes, 2-3 times per day.  Take medicines only as directed by your health care provider.  Rest the injured area. Avoid strenuous activity and any activities or movements that cause pain. Be careful during activities and avoid bumping the injured area.  Perform deep-breathing exercises as directed by your health care provider.  Do not lift anything that is heavier than 5 lb (2.3 kg) until your health care provider approves.  Do not use any tobacco products, including cigarettes, chewing tobacco, or electronic cigarettes. If you need help quitting, ask your health care provider. Contact a health care provider if:  You have increased bruising or swelling.  You have pain that is not controlled with treatment.  You have a fever. Get help right away if:  You have difficulty breathing or shortness of breath.  You develop a continual cough, or you cough up thick or bloody sputum.  You feel sick to your stomach (nauseous), you throw up (vomit), or you have abdominal pain. This information is not intended to replace advice given to you by your  health care provider. Make sure you discuss any questions you have with your health care provider. Document Released: 10/17/2000 Document Revised: 06/30/2015 Document Reviewed: 11/03/2013  2017 Elsevier

## 2016-05-12 ENCOUNTER — Other Ambulatory Visit: Payer: Self-pay | Admitting: Family Medicine

## 2016-05-12 DIAGNOSIS — I10 Essential (primary) hypertension: Secondary | ICD-10-CM

## 2016-05-16 ENCOUNTER — Other Ambulatory Visit: Payer: Self-pay

## 2016-05-24 ENCOUNTER — Telehealth: Payer: Self-pay | Admitting: Allergy and Immunology

## 2016-05-24 NOTE — Telephone Encounter (Signed)
Pt wife called and said that he was sick and wheezing and needed appointment as so as possible. [phone]

## 2016-05-24 NOTE — Telephone Encounter (Signed)
Spoke to wife and patient is coming in the morning to be seen.

## 2016-05-25 ENCOUNTER — Encounter: Payer: Self-pay | Admitting: Allergy

## 2016-05-25 ENCOUNTER — Ambulatory Visit (INDEPENDENT_AMBULATORY_CARE_PROVIDER_SITE_OTHER): Payer: PRIVATE HEALTH INSURANCE | Admitting: Allergy

## 2016-05-25 VITALS — BP 120/70 | HR 78 | Resp 16 | Ht 70.0 in | Wt 183.0 lb

## 2016-05-25 DIAGNOSIS — J309 Allergic rhinitis, unspecified: Secondary | ICD-10-CM

## 2016-05-25 DIAGNOSIS — H1010 Acute atopic conjunctivitis, unspecified eye: Secondary | ICD-10-CM | POA: Diagnosis not present

## 2016-05-25 DIAGNOSIS — R062 Wheezing: Secondary | ICD-10-CM

## 2016-05-25 MED ORDER — ALBUTEROL SULFATE HFA 108 (90 BASE) MCG/ACT IN AERS: 2.0000 | INHALATION_SPRAY | RESPIRATORY_TRACT | 3 refills | Status: DC | PRN

## 2016-05-25 MED ORDER — METHYLPREDNISOLONE ACETATE 80 MG/ML IJ SUSP
80.0000 mg | Freq: Once | INTRAMUSCULAR | Status: AC
  Administered 2016-05-25: 80 mg via INTRAMUSCULAR

## 2016-05-25 MED ORDER — AZELASTINE-FLUTICASONE 137-50 MCG/ACT NA SUSP: NASAL | 5 refills | Status: DC

## 2016-05-25 MED ORDER — BEPOTASTINE BESILATE 1.5 % OP SOLN: OPHTHALMIC | 5 refills | Status: DC

## 2016-05-25 MED ORDER — MONTELUKAST SODIUM 10 MG PO TABS: ORAL_TABLET | ORAL | 5 refills | Status: DC

## 2016-05-25 NOTE — Progress Notes (Signed)
Follow-up Note  RE: Christopher Case MRN: [id number] DOB: [birth year] Date of Office Visit: 05/25/2016   History of present illness: Christopher Case is a 51 y.o. male presenting today for sick visit.  He was last seen in the office by Dr. Willa Rough on 05/13/15 for allergic rhinoconjunctivitis and cough.   He presents today with his wife.   She reports he struggles with his allergies every year around this time.  He states he is having headaches, runny nose, swollen and red eyes and is sleeping poorly due to these symptoms.   Symptoms have been worsening over the past 2-3 weeks.  He denies any fevers.   He has run out of his allergy medications recommended at last visit.  He is currently taking Allegra  and using Visine eye drop with little relief.  He is also coughing and wheezing.  He denies a history of wheezing before and he states he has never used an inhaler previously.   He was on AIT for several years but reports he had to stop as he was having significant arm swelling b/l and states he was getting 2 injections in each arm.  He could not tolerate the local reactions.      Review of systems: Review of Systems  Constitutional: Negative for chills, fever and malaise/fatigue.  HENT: Positive for congestion and sinus pain. Negative for ear discharge, ear pain, nosebleeds, sore throat and tinnitus.   Eyes: Positive for redness. Negative for pain and discharge.  Respiratory: Positive for cough and wheezing. Negative for shortness of breath.   Cardiovascular: Negative for chest pain.  Gastrointestinal: Negative for abdominal pain, heartburn, nausea and vomiting.  Musculoskeletal: Negative for joint pain and myalgias.  Skin: Negative for itching and rash.  Neurological: Positive for headaches. Negative for dizziness.    All other systems negative unless noted above in HPI  Past medical/social/surgical/family history have been reviewed and are unchanged unless specifically indicated below.  No  changes  Medication List: Allergies as of 05/25/2016   No Known Allergies     Medication List       Accurate as of 05/25/16 12:41 PM. Always use your most recent med list.          Azelastine-Fluticasone 137-50 MCG/ACT Susp Use one spray in each nostril twice daily.   Bepotastine Besilate 1.5 % Soln Commonly known as:  BEPREVE Apply one drop in each eye twice daily as needed for itchy eyes.   cetirizine 10 MG tablet Commonly known as:  ZYRTEC Take 10 mg by mouth daily.   hydrochlorothiazide 50 MG tablet Commonly known as:  HYDRODIURIL Take 1 tablet (50 mg total) by mouth daily.   montelukast 10 MG tablet Commonly known as:  SINGULAIR Take one tablet daily in the evening to prevent cough or wheeze.       Known medication allergies: No Known Allergies   Physical examination: Blood pressure 120/70, pulse 78, resp. rate 16, height  (1.778 m), weight 183 lb (83 kg), SpO2 95 %.  General: Alert, interactive, in no acute distress. HEENT: PERRLA, with mild periorbital edema and conjunctival erythema, TMs pearly gray, turbinates moderately edematous with clear discharge, post-pharynx non erythematous. Neck: Supple without lymphadenopathy. Lungs: expiratory wheezing throughout lung functions. {no increased work of breathing.   Follow albuterol neb wheezing resolved CV: Normal S1, S2 without murmurs. Abdomen: Nondistended, nontender. Skin: Warm and dry, without lesions or rashes. Extremities:  No clubbing, cyanosis or edema. Neuro:   Grossly intact.  Diagnositics/Labs:  None today  Assessment and plan: Allergic rhinoconjunctivitis - poor symptom control Wheezing, allergic   1. Avoidance: Pollen, mold, cat, dog    2. Antihistamine: Allegra  by mouth once daily for runny nose or itching.   (to use up your Allegra  tablets take 2 tabs twice a day for next week).     3. Nasal Spray: Dymista one spray(s) each nostril twice daily for stuffy nose or  drainage (use sample until gone).    4. Eye Drops: Bepreve one drop(s) each eye twice daily for itchy eyes.  5. Other: Restart Singulair  each evening.                 Albuterol inhaler 2 puffs every 4-6 hours as needed for cough or wheezing                 Depo-medrol injection provided in clinic today  6. Nasal Saline wash each evening at shower time.   7. Follow up Visit: late fall/early winter for repeat skin testing. Please hold your antihistamine x 3 days for this visit.    I appreciate the opportunity to take part in Christopher Case's care. Please do not hesitate to contact me with questions.  Sincerely,   Margo Aye, MD Allergy/Immunology [facility]

## 2016-05-25 NOTE — Patient Instructions (Addendum)
   1. Avoidance: Pollen, mold, cat, dog   2. Antihistamine: Allegra  by mouth once daily for runny nose or itching.   (to use up your Allegra  tablets take 2 tabs twice a day for next week).     3. Nasal Spray: Dymista one spray(s) each nostril twice daily for stuffy nose or drainage (use sample until gone).    4. Eye Drops: Bepreve one drop(s) each eye twice daily for itchy eyes.  5. Other: Restart Singulair  each evening.                 Albuterol inhaler 2 puffs every 4-6 hours as needed for cough or wheezing                 Depo-medrol injection provided in clinic today  6. Nasal Saline wash each evening at shower time.   7. Follow up Visit: late fall/early winter for repeat skin testing. Please hold your antihistamine x 3 days for this visit.    Websites that have reliable Patient information: 1. American Academy of Asthma, Allergy, & Immunology: www.aaaai.org 2. Food Allergy Network: www.foodallergy.org 3. Mothers of Asthmatics: www.aanma.org 4. [facility]: https://www.strong.com/ 5. American College of Allergy, Asthma, & Immunology: BiggerRewards.is or www.acaai.org

## 2016-06-15 ENCOUNTER — Other Ambulatory Visit: Payer: Self-pay | Admitting: Family Medicine

## 2016-06-15 ENCOUNTER — Other Ambulatory Visit: Payer: Self-pay | Admitting: Emergency Medicine

## 2016-06-15 DIAGNOSIS — I10 Essential (primary) hypertension: Secondary | ICD-10-CM

## 2016-07-10 ENCOUNTER — Ambulatory Visit (INDEPENDENT_AMBULATORY_CARE_PROVIDER_SITE_OTHER): Payer: PRIVATE HEALTH INSURANCE | Admitting: Urgent Care

## 2016-07-10 ENCOUNTER — Encounter: Payer: Self-pay | Admitting: Urgent Care

## 2016-07-10 VITALS — BP 136/66 | HR 66 | Temp 98.2°F | Resp 15 | Ht 69.75 in | Wt 173.6 lb

## 2016-07-10 DIAGNOSIS — I10 Essential (primary) hypertension: Secondary | ICD-10-CM | POA: Diagnosis not present

## 2016-07-10 DIAGNOSIS — E782 Mixed hyperlipidemia: Secondary | ICD-10-CM

## 2016-07-10 MED ORDER — LISINOPRIL 5 MG PO TABS: 5.0000 mg | ORAL_TABLET | Freq: Every day | ORAL | 1 refills | Status: DC

## 2016-07-10 MED ORDER — HYDROCHLOROTHIAZIDE 25 MG PO TABS: 25.0000 mg | ORAL_TABLET | Freq: Every day | ORAL | 1 refills | Status: DC

## 2016-07-10 NOTE — Progress Notes (Signed)
   MRN: [id number] DOB: 26-Feb-1965  Subjective:   Christopher Case is a 51 y.o. male presenting for follow up on HTN. Avoids salt in his diet. Checks BP at home and usually ranges 120's systolic. Denies dizziness, chronic headache, blurred vision, chest pain, shortness of breath, heart racing, palpitations, nausea, vomiting, abdominal pain, hematuria, lower leg swelling. Denies smoking cigarettes.  Christopher Case has a current medication list which includes the following prescription(s): cetirizine, hydrochlorothiazide, albuterol, azelastine-fluticasone, bepotastine besilate, and montelukast. Also has No Known Allergies. Christopher Case  has a past medical history of Allergic rhinitis and HTN (hypertension). Also  has a past surgical history that includes No past surgeries.  Objective:   Vitals: BP 136/66 (BP Location: Right Arm, Patient Position: Sitting, Cuff Size: Normal)   Pulse 66   Temp 98.2 F (36.8 C) (Oral)   Resp 15   Ht 5' 9.75" (1.772 m)   Wt 173 lb 9.6 oz (78.7 kg)   SpO2 99%   BMI 25.09 kg/m   BP Readings from Last 3 Encounters:  07/10/16 136/66  05/25/16 120/70  03/16/16 (!) 144/66    Physical Exam  Constitutional: He is oriented to person, place, and time. He appears well-developed and well-nourished.  Eyes: No scleral icterus.  Cardiovascular: Normal rate, regular rhythm and intact distal pulses.  Exam reveals no gallop and no friction rub.   No murmur heard. Pulmonary/Chest: No respiratory distress. He has no wheezes. He has no rales.  Musculoskeletal: He exhibits no edema.  Neurological: He is alert and oriented to person, place, and time.  Psychiatric: He has a normal mood and affect.   Assessment and Plan :   1. Essential hypertension - Per UpToDate, max recommended dose of HCTZ for HTN is 25mg . Patient has not had potassium levels checked for more than a year, labs pending today. Will decrease HCTZ to 25mg , add lisinopril. Return-to-clinic precautions discussed, patient  verbalized understanding. Follow up in 4 weeks. - Comprehensive metabolic panel - Microalbumin/Creatinine Ratio, Urine - hydrochlorothiazide (HYDRODIURIL) 25 MG tablet; Take 1 tablet (25 mg total) by mouth daily.  Dispense: 90 tablet; Refill: 1 - lisinopril (PRINIVIL,ZESTRIL) 5 MG tablet; Take 1 tablet (5 mg total) by mouth daily.  Dispense: 90 tablet; Refill: 1  2. Mixed hyperlipidemia - Lipid panel pending.  Wallis BambergMario Emmanuelle Hibbitts, PA-C [facility] [phone] 07/10/2016 8:54 AM

## 2016-07-10 NOTE — Patient Instructions (Addendum)
Hypertension Hypertension, commonly called high blood pressure, is when the force of blood pumping through the arteries is too strong. The arteries are the blood vessels that carry blood from the heart throughout the body. Hypertension forces the heart to work harder to pump blood and may cause arteries to become narrow or stiff. Having untreated or uncontrolled hypertension can cause heart attacks, strokes, kidney disease, and other problems. A blood pressure reading consists of a higher number over a lower number. Ideally, your blood pressure should be below 120/80. The first ("top") number is called the systolic pressure. It is a measure of the pressure in your arteries as your heart beats. The second ("bottom") number is called the diastolic pressure. It is a measure of the pressure in your arteries as the heart relaxes. What are the causes? The cause of this condition is not known. What increases the risk? Some risk factors for high blood pressure are under your control. Others are not. Factors you can change  Smoking.  Having type 2 diabetes mellitus, high cholesterol, or both.  Not getting enough exercise or physical activity.  Being overweight.  Having too much fat, sugar, calories, or salt (sodium) in your diet.  Drinking too much alcohol. Factors that are difficult or impossible to change  Having chronic kidney disease.  Having a family history of high blood pressure.  Age. Risk increases with age.  Race. You may be at higher risk if you are African-American.  Gender. Men are at higher risk than women before age 45. After age 65, women are at higher risk than men.  Having obstructive sleep apnea.  Stress. What are the signs or symptoms? Extremely high blood pressure (hypertensive crisis) may cause:  Headache.  Anxiety.  Shortness of breath.  Nosebleed.  Nausea and vomiting.  Severe chest pain.  Jerky movements you cannot control (seizures).  How is this  diagnosed? This condition is diagnosed by measuring your blood pressure while you are seated, with your arm resting on a surface. The cuff of the blood pressure monitor will be placed directly against the skin of your upper arm at the level of your heart. It should be measured at least twice using the same arm. Certain conditions can cause a difference in blood pressure between your right and left arms. Certain factors can cause blood pressure readings to be lower or higher than normal (elevated) for a short period of time:  When your blood pressure is higher when you are in a health care provider's office than when you are at home, this is called white coat hypertension. Most people with this condition do not need medicines.  When your blood pressure is higher at home than when you are in a health care provider's office, this is called masked hypertension. Most people with this condition may need medicines to control blood pressure.  If you have a high blood pressure reading during one visit or you have normal blood pressure with other risk factors:  You may be asked to return on a different day to have your blood pressure checked again.  You may be asked to monitor your blood pressure at home for 1 week or longer.  If you are diagnosed with hypertension, you may have other blood or imaging tests to help your health care provider understand your overall risk for other conditions. How is this treated? This condition is treated by making healthy lifestyle changes, such as eating healthy foods, exercising more, and reducing your alcohol intake. Your   health care provider may prescribe medicine if lifestyle changes are not enough to get your blood pressure under control, and if:  Your systolic blood pressure is above 130.  Your diastolic blood pressure is above 80.  Your personal target blood pressure may vary depending on your medical conditions, your age, and other factors. Follow these  instructions at home: Eating and drinking  Eat a diet that is high in fiber and potassium, and low in sodium, added sugar, and fat. An example eating plan is called the DASH (Dietary Approaches to Stop Hypertension) diet. To eat this way: ? Eat plenty of fresh fruits and vegetables. Try to fill half of your plate at each meal with fruits and vegetables. ? Eat whole grains, such as whole wheat pasta, brown rice, or whole grain bread. Fill about one quarter of your plate with whole grains. ? Eat or drink low-fat dairy products, such as skim milk or low-fat yogurt. ? Avoid fatty cuts of meat, processed or cured meats, and poultry with skin. Fill about one quarter of your plate with lean proteins, such as fish, chicken without skin, beans, eggs, and tofu. ? Avoid premade and processed foods. These tend to be higher in sodium, added sugar, and fat.  Reduce your daily sodium intake. Most people with hypertension should eat less than 1,500 mg of sodium a day.  Limit alcohol intake to no more than 1 drink a day for nonpregnant women and 2 drinks a day for men. One drink equals 12 oz of beer, 5 oz of wine, or 1 oz of hard liquor. Lifestyle  Work with your health care provider to maintain a healthy body weight or to lose weight. Ask what an ideal weight is for you.  Get at least 30 minutes of exercise that causes your heart to beat faster (aerobic exercise) most days of the week. Activities may include walking, swimming, or biking.  Include exercise to strengthen your muscles (resistance exercise), such as pilates or lifting weights, as part of your weekly exercise routine. Try to do these types of exercises for 30 minutes at least 3 days a week.  Do not use any products that contain nicotine or tobacco, such as cigarettes and e-cigarettes. If you need help quitting, ask your health care provider.  Monitor your blood pressure at home as told by your health care provider.  Keep all follow-up visits as  told by your health care provider. This is important. Medicines  Take over-the-counter and prescription medicines only as told by your health care provider. Follow directions carefully. Blood pressure medicines must be taken as prescribed.  Do not skip doses of blood pressure medicine. Doing this puts you at risk for problems and can make the medicine less effective.  Ask your health care provider about side effects or reactions to medicines that you should watch for. Contact a health care provider if:  You think you are having a reaction to a medicine you are taking.  You have headaches that keep coming back (recurring).  You feel dizzy.  You have swelling in your ankles.  You have trouble with your vision. Get help right away if:  You develop a severe headache or confusion.  You have unusual weakness or numbness.  You feel faint.  You have severe pain in your chest or abdomen.  You vomit repeatedly.  You have trouble breathing. Summary  Hypertension is when the force of blood pumping through your arteries is too strong. If this condition is not   controlled, it may put you at risk for serious complications.  Your personal target blood pressure may vary depending on your medical conditions, your age, and other factors. For most people, a normal blood pressure is less than 120/80.  Hypertension is treated with lifestyle changes, medicines, or a combination of both. Lifestyle changes include weight loss, eating a healthy, low-sodium diet, exercising more, and limiting alcohol. This information is not intended to replace advice given to you by your health care provider. Make sure you discuss any questions you have with your health care provider. Document Released: 01/22/2005 Document Revised: 12/21/2015 Document Reviewed: 12/21/2015 Elsevier Interactive Patient Education  2018 Elsevier Inc.     IF you received an x-ray today, you will receive an invoice from [facility]. Please contact [facility] at [phone] with questions or concerns regarding your invoice.   IF you received labwork today, you will receive an invoice from [facility]. Please contact [facility] at [phone] with questions or concerns regarding your invoice.   Our billing staff will not be able to assist you with questions regarding bills from these companies.  You will be contacted with the lab results as soon as they are available. The fastest way to get your results is to activate your My Chart account. Instructions are located on the last page of this paperwork. If you have not heard from us regarding the results in 2 weeks, please contact this office.     

## 2016-07-11 LAB — COMPREHENSIVE METABOLIC PANEL
A/G RATIO: 1.6 (ref 1.2–2.2)
ALT: 21 IU/L (ref 0–44)
AST: 21 IU/L (ref 0–40)
Albumin: 4.4 g/dL (ref 3.5–5.5)
Alkaline Phosphatase: 86 IU/L (ref 39–117)
BILIRUBIN TOTAL: 0.3 mg/dL (ref 0.0–1.2)
BUN/Creatinine Ratio: 15 (ref 9–20)
BUN: 16 mg/dL (ref 6–24)
CALCIUM: 9.3 mg/dL (ref 8.7–10.2)
CO2: 22 mmol/L (ref 18–29)
Chloride: 105 mmol/L (ref 96–106)
Creatinine, Ser: 1.08 mg/dL (ref 0.76–1.27)
GFR, EST AFRICAN AMERICAN: 92 mL/min/{1.73_m2} (ref >59)
GFR, EST NON AFRICAN AMERICAN: 80 mL/min/{1.73_m2} (ref >59)
GLOBULIN, TOTAL: 2.7 g/dL (ref 1.5–4.5)
Glucose: 105 mg/dL — ABNORMAL HIGH (ref 65–99)
POTASSIUM: 4.9 mmol/L (ref 3.5–5.2)
SODIUM: 143 mmol/L (ref 134–144)
TOTAL PROTEIN: 7.1 g/dL (ref 6.0–8.5)

## 2016-07-11 LAB — MICROALBUMIN / CREATININE URINE RATIO
Creatinine, Urine: 140.6 mg/dL
MICROALB/CREAT RATIO: 170.7 mg/g{creat} — AB (ref 0.0–30.0)
Microalbumin, Urine: 240 ug/mL

## 2016-07-11 LAB — LIPID PANEL
CHOL/HDL RATIO: 4.5 ratio (ref 0.0–5.0)
Cholesterol, Total: 194 mg/dL (ref 100–199)
HDL: 43 mg/dL (ref >39)
LDL Calculated: 137 mg/dL — ABNORMAL HIGH (ref 0–99)
Triglycerides: 68 mg/dL (ref 0–149)
VLDL Cholesterol Cal: 14 mg/dL (ref 5–40)

## 2017-05-02 IMAGING — DX DG RIBS W/ CHEST 3+V*R*
4 series · 4 of 4 positions shown · non-contrast
Comparison: None.

CLINICAL DATA: 50-year-old male with chest and rib injury with
right rib pain. Initial encounter.

EXAM:
RIGHT RIBS AND CHEST - 3+ VIEW

[chest pa]
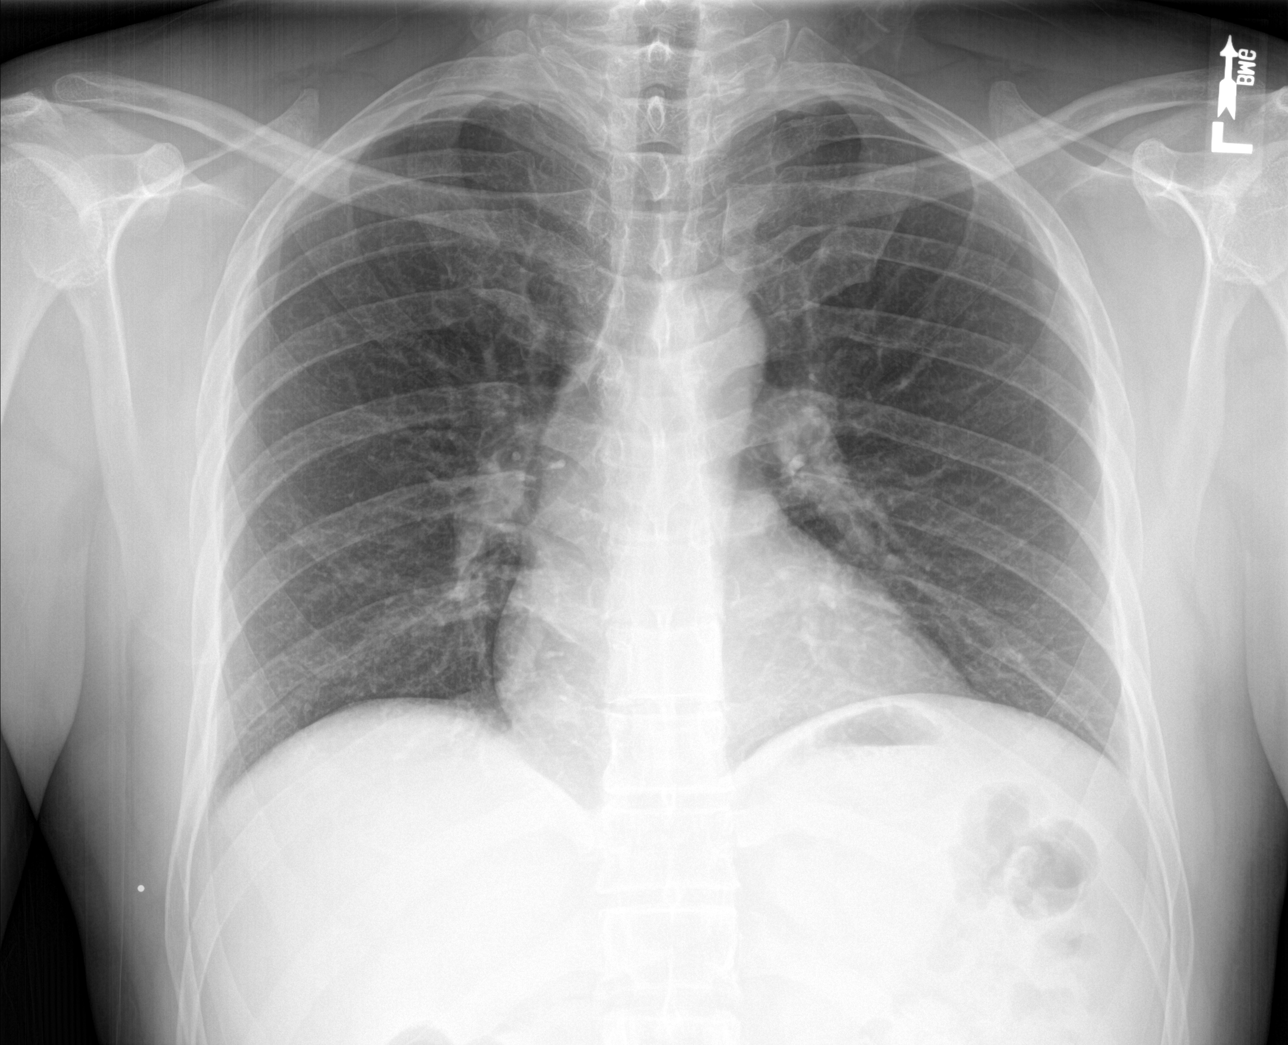

[rib obl (1 of 2)]
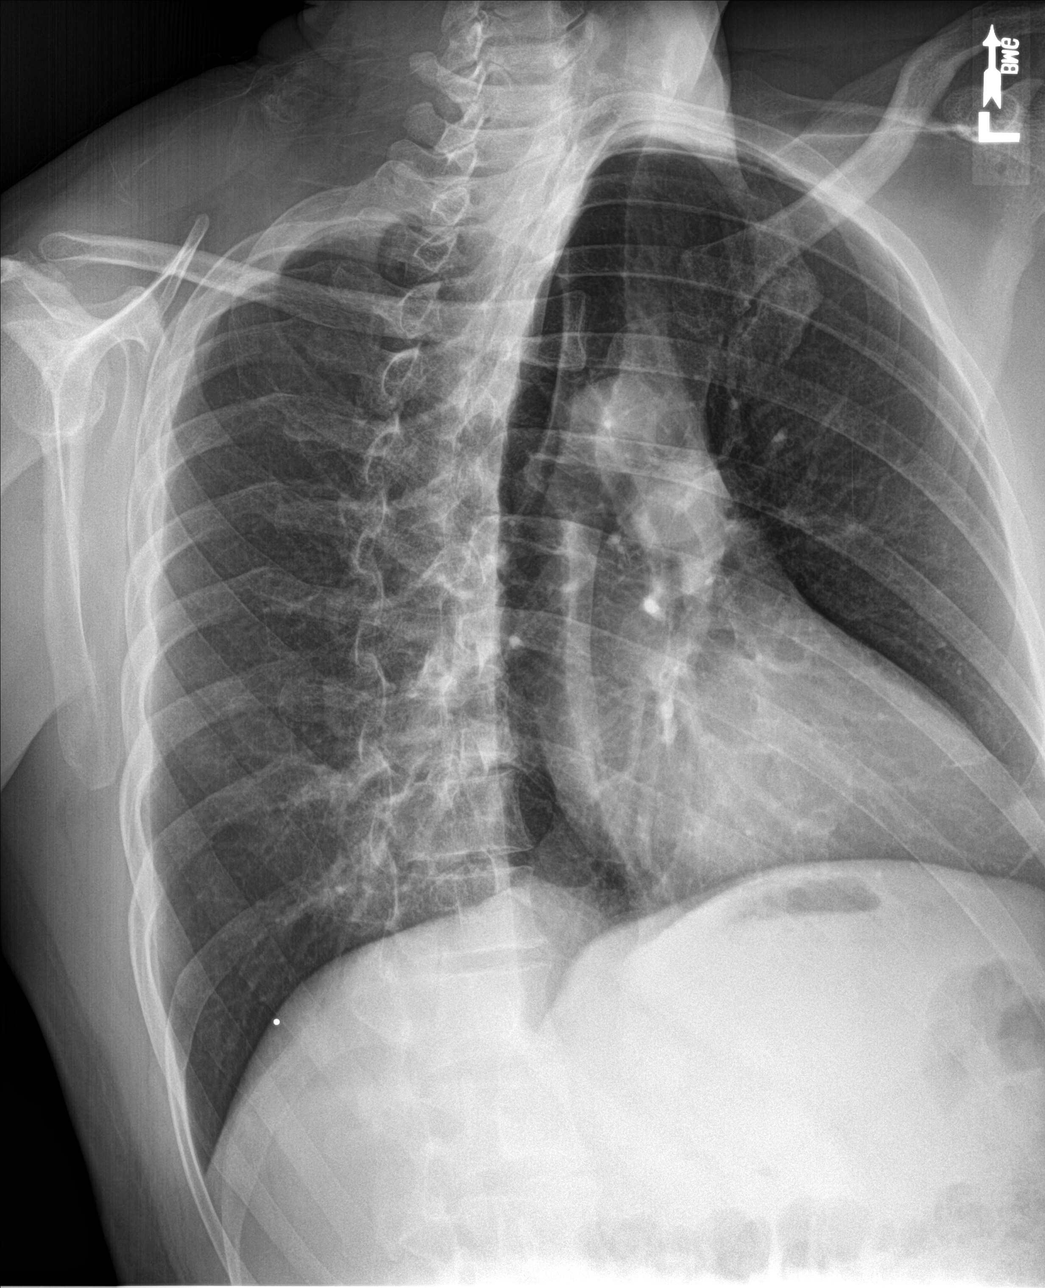

[rib obl (2 of 2)]
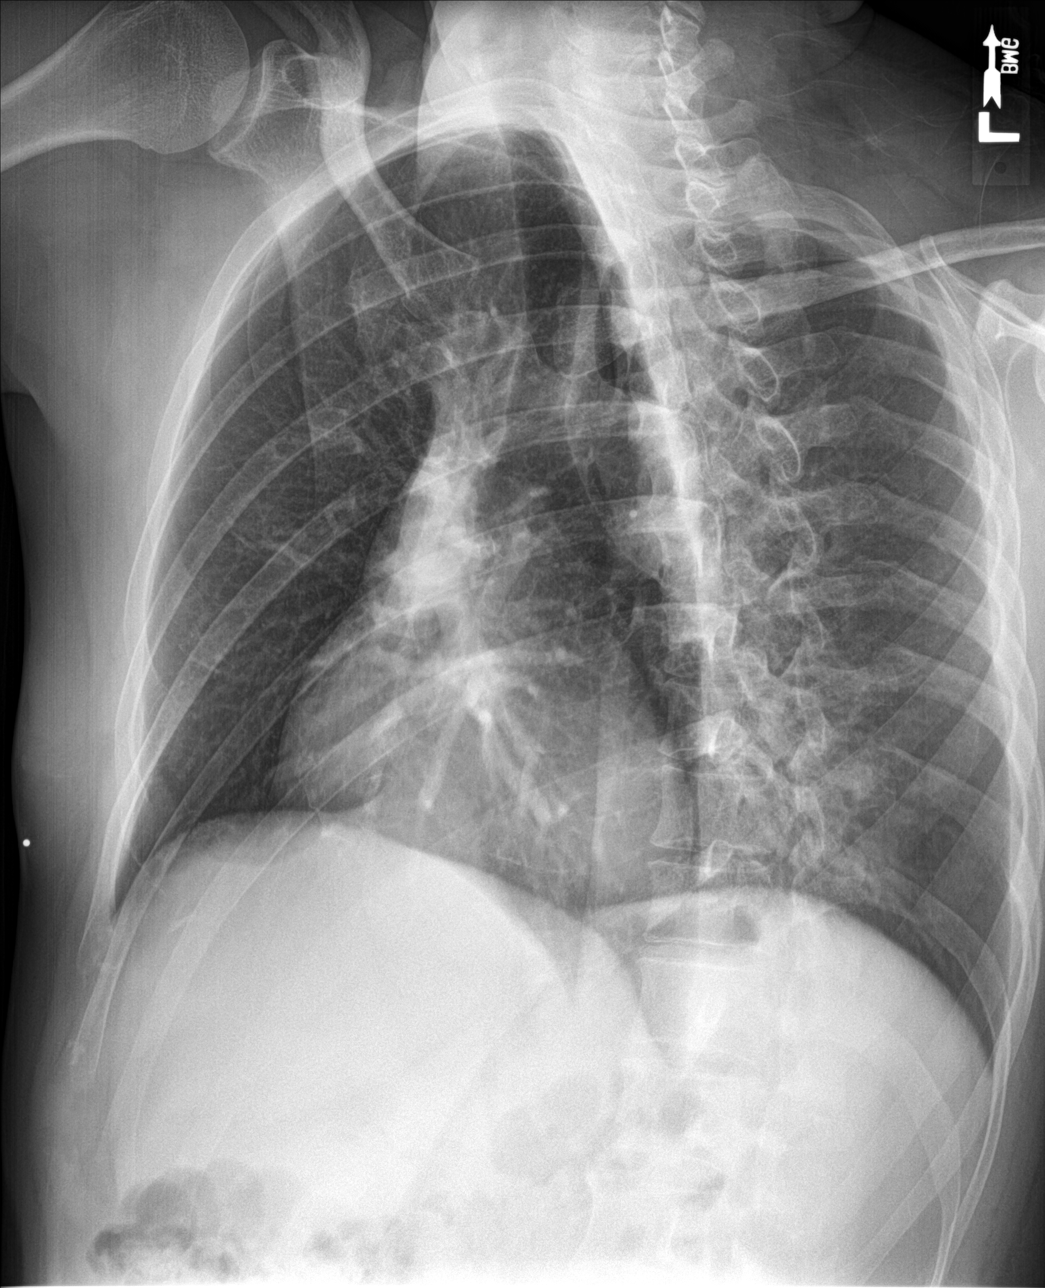

[chest ap]
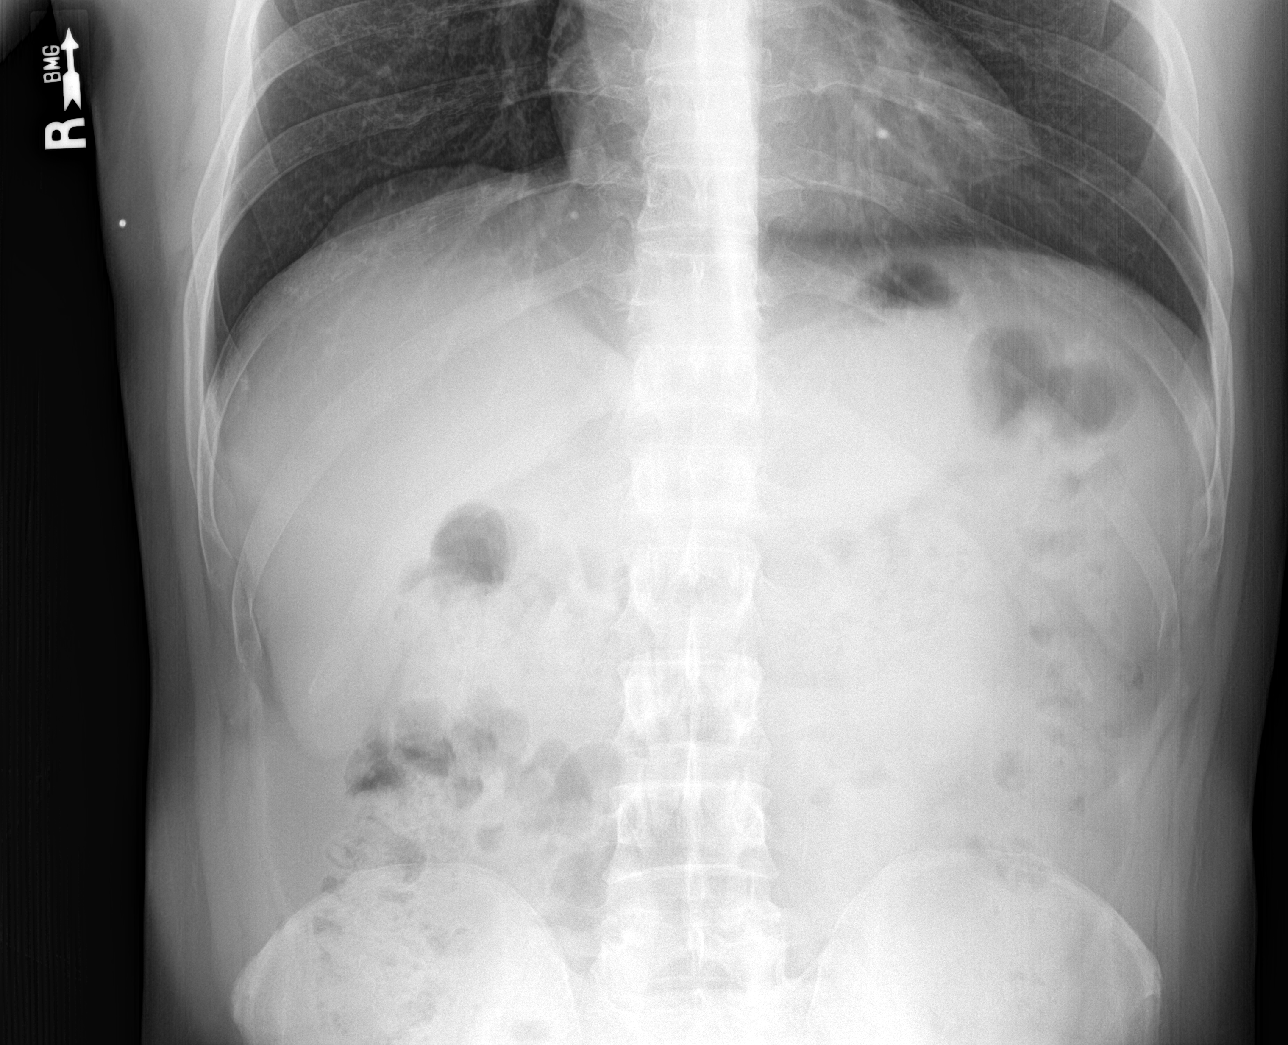

[4 of 4 positions shown; findings below may reference images not displayed]

FINDINGS: The cardiomediastinal silhouette is unremarkable.

There is no evidence of focal airspace disease, pulmonary edema,
suspicious pulmonary nodule/mass, pleural effusion, or pneumothorax.
No acute bony abnormalities are identified.

No definite acute fracture identified.
IMPRESSION: Negative.

## 2017-05-13 ENCOUNTER — Other Ambulatory Visit: Payer: Self-pay | Admitting: Urgent Care

## 2017-05-13 DIAGNOSIS — I10 Essential (primary) hypertension: Secondary | ICD-10-CM

## 2017-05-14 NOTE — Telephone Encounter (Signed)
Lisinopril and hydrochlorothiazide refill requests  LOV 07/10/16 with Wallis BambergMario Mani  [facility] [address] - [place], [place] - [id number]

## 2017-05-15 NOTE — Telephone Encounter (Signed)
Spoke with wife per DPR [facility]

## 2017-05-17 ENCOUNTER — Other Ambulatory Visit: Payer: Self-pay

## 2017-05-17 ENCOUNTER — Ambulatory Visit (INDEPENDENT_AMBULATORY_CARE_PROVIDER_SITE_OTHER): Payer: PRIVATE HEALTH INSURANCE | Admitting: Urgent Care

## 2017-05-17 ENCOUNTER — Encounter: Payer: Self-pay | Admitting: Urgent Care

## 2017-05-17 VITALS — BP 142/80 | HR 76 | Temp 98.1°F | Resp 16 | Ht 69.0 in | Wt 180.0 lb

## 2017-05-17 DIAGNOSIS — E782 Mixed hyperlipidemia: Secondary | ICD-10-CM | POA: Diagnosis not present

## 2017-05-17 DIAGNOSIS — Z1211 Encounter for screening for malignant neoplasm of colon: Secondary | ICD-10-CM | POA: Diagnosis not present

## 2017-05-17 DIAGNOSIS — Z130 Encounter for screening for diseases of the blood and blood-forming organs and certain disorders involving the immune mechanism: Secondary | ICD-10-CM

## 2017-05-17 DIAGNOSIS — I10 Essential (primary) hypertension: Secondary | ICD-10-CM

## 2017-05-17 DIAGNOSIS — Z1329 Encounter for screening for other suspected endocrine disorder: Secondary | ICD-10-CM

## 2017-05-17 DIAGNOSIS — Z0000 Encounter for general adult medical examination without abnormal findings: Secondary | ICD-10-CM | POA: Diagnosis not present

## 2017-05-17 DIAGNOSIS — Z114 Encounter for screening for human immunodeficiency virus [HIV]: Secondary | ICD-10-CM | POA: Diagnosis not present

## 2017-05-17 DIAGNOSIS — H1013 Acute atopic conjunctivitis, bilateral: Secondary | ICD-10-CM | POA: Diagnosis not present

## 2017-05-17 DIAGNOSIS — J302 Other seasonal allergic rhinitis: Secondary | ICD-10-CM

## 2017-05-17 MED ORDER — AZELASTINE HCL 0.05 % OP SOLN: 1.0000 [drp] | Freq: Two times a day (BID) | OPHTHALMIC | 12 refills | Status: DC

## 2017-05-17 MED ORDER — POLYMYXIN B-TRIMETHOPRIM 10000-0.1 UNIT/ML-% OP SOLN: 1.0000 [drp] | OPHTHALMIC | 0 refills | Status: DC

## 2017-05-17 MED ORDER — HYDROCHLOROTHIAZIDE 25 MG PO TABS: 25.0000 mg | ORAL_TABLET | Freq: Every day | ORAL | 3 refills | Status: DC

## 2017-05-17 MED ORDER — LISINOPRIL 10 MG PO TABS
10.0000 mg | ORAL_TABLET | Freq: Every day | ORAL | 3 refills | Status: DC
Start: 2017-05-17 — End: 2019-12-15

## 2017-05-17 MED ORDER — CETIRIZINE HCL 10 MG PO TABS: 10.0000 mg | ORAL_TABLET | Freq: Every day | ORAL | 3 refills | Status: DC

## 2017-05-17 NOTE — Progress Notes (Signed)
MRN: [id number]  Subjective:   [name] is a 52 y.o. male presenting for annual physical exam and medication refills, allergies.  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  He has 3 boys and is very active with them. Has good relationships at home, has a good support network. Denies smoking cigarettes or drinking alcohol.   Medical care team includes: PCP: Patient, No Pcp Per Vision: No visual deficits. Dental: Does not get regular dental care.  Specialists: None.  Health Maintenance: Needs colonoscopy for colon cancer screening.  Patient's immunizations are up-to-date.  We will screen for HIV as a general check.   Red eyes - Reports 1 week history of bilateral eye redness, wakes up in the morning with matted eyes that clears up throughout the day but still has tearing of his eyes.  He is also having mucus from his nose that has some bloody streaks, intermittent headaches.  Denies review of systems as below.  Admits history of allergies, is not currently taking his Zyrtec.  Hypertension -  Managed with hydrochlorothiazide and lisinopril.  Patient checks his blood pressure at home and is generally in the 140s.  Tries to avoid salt and stays active with his sons.  Review of systems as below.  Christopher Case has a current medication list which includes the following prescription(s): cetirizine, hydrochlorothiazide, and lisinopril. He has No Known Allergies. Christopher Case  has a past medical history of Allergic rhinitis and HTN (hypertension). Also  has a past surgical history that includes No past surgeries. His family history includes Cancer in his sister; Hypertension in his father.  Review of Systems  Constitutional: Negative for chills, diaphoresis, fever, malaise/fatigue and weight loss.  HENT: Negative for congestion, ear discharge, ear pain, hearing loss, nosebleeds, sore throat and tinnitus.   Eyes: Positive for discharge and redness. Negative for blurred vision,  double vision, photophobia and pain.  Respiratory: Negative for cough, shortness of breath and wheezing.   Cardiovascular: Negative for chest pain, palpitations and leg swelling.  Gastrointestinal: Negative for abdominal pain, blood in stool, constipation, diarrhea, nausea and vomiting.  Genitourinary: Negative for dysuria, flank pain, frequency, hematuria and urgency.  Musculoskeletal: Negative for back pain, joint pain and myalgias.  Skin: Negative for itching and rash.  Neurological: Negative for dizziness, tingling, seizures, loss of consciousness, weakness and headaches.  Endo/Heme/Allergies: Positive for environmental allergies. Negative for polydipsia.  Psychiatric/Behavioral: Negative for depression, hallucinations, memory loss, substance abuse and suicidal ideas. The patient is not nervous/anxious and does not have insomnia.    Objective:   Vitals: BP (!) 142/80   Pulse 76   Temp 98.1 F (36.7 C) (Oral)   Resp 16   Ht 5\' 9"  (1.753 m)   Wt 180 lb (81.6 kg)   SpO2 98%   BMI 26.58 kg/m    Visual Acuity Screening   Right eye Left eye Both eyes  Without correction: 20/25 20/25 20/25   With correction:        The 10-year ASCVD risk score Denman George(Goff DC Jr., et al., 2013) is: 6%   Values used to calculate the score:     Age: 89 years     Sex: Male     Is Non-Hispanic African American: No     Diabetic: No     Tobacco smoker: No     Systolic Blood Pressure: 142 mmHg     Is BP treated: Yes     HDL Cholesterol: 43 mg/dL     Total Cholesterol:  194 mg/dL  Physical Exam  Constitutional: He is oriented to person, place, and time. He appears well-developed and well-nourished.  HENT:  TM's intact bilaterally, no effusions or erythema. Nasal turbinates pink and moist, nasal passages patent. No sinus tenderness. Oropharynx clear, mucous membranes moist, dentition in good repair.  Eyes: Pupils are equal, round, and reactive to light. EOM are normal. Right eye exhibits no discharge.  Left eye exhibits no discharge. Right conjunctiva is injected. Right conjunctiva has no hemorrhage. Left conjunctiva is injected. Left conjunctiva has no hemorrhage. No scleral icterus.  Neck: Normal range of motion. Neck supple. No thyromegaly present.  Cardiovascular: Normal rate, regular rhythm and intact distal pulses. Exam reveals no gallop and no friction rub.  No murmur heard. Pulmonary/Chest: No stridor. No respiratory distress. He has no wheezes. He has no rales.  Abdominal: Soft. Bowel sounds are normal. He exhibits no distension and no mass. There is no tenderness.  Musculoskeletal: Normal range of motion. He exhibits no edema or tenderness.  Lymphadenopathy:    He has no cervical adenopathy.  Neurological: He is alert and oriented to person, place, and time. He has normal reflexes. He displays normal reflexes. Coordination normal.  Skin: Skin is warm and dry. No rash noted. No erythema. No pallor.  Psychiatric: He has a normal mood and affect.   Assessment and Plan :   Annual physical exam  Essential hypertension - Plan: Comprehensive metabolic panel, hydrochlorothiazide (HYDRODIURIL) 25 MG tablet, lisinopril (PRINIVIL,ZESTRIL) 10 MG tablet  Mixed hyperlipidemia - Plan: Lipid panel  Seasonal allergies  Screening for thyroid disorder - Plan: TSH  Screening for deficiency anemia - Plan: CBC  Screening for HIV without presence of risk factors - Plan: HIV antibody  Screen for colon cancer - Plan: Ambulatory referral to Gastroenterology  Allergic conjunctivitis of both eyes  Patient is medically stable, labs pending. Discussed healthy lifestyle, diet, exercise, preventative care, vaccinations, and addressed patient's concerns.    Have patient start Polytrim and azelastine to cover for both bacteria and allergic conjunctivitis.  We will increase his lisinopril to 10 mg and maintain his hydrochlorothiazide at 25 mg.  Counseled on need for better blood pressure control.   Patient will make sure that he is avoiding salt in his diet.  He is to continue checking his blood pressure at home and return to clinic if his blood pressure is still in the 140s or higher.  Otherwise follow-up in 1 year.  Patient advised to see me at the urgent care in 2 days if he has no improvement of his red eyes.  Wallis Bamberg, PA-C [facility] [phone] 05/17/2017  8:55 AM

## 2017-05-17 NOTE — Patient Instructions (Addendum)
If you have no improvement with your eyes, then come see me at the Urgent Care at [facility] (not the emergency department). Hydrate well with at least 2 liters (1 gallon) of water daily to help with allergies. Also take Zyrtec daily for this. With the antibiotic eye drops (Polytrim), you do not need to use this past 1 week. Continue to use azelastine eye drops for allergies as needed.   Hypertension Hypertension, commonly called high blood pressure, is when the force of blood pumping through the arteries is too strong. The arteries are the blood vessels that carry blood from the heart throughout the body. Hypertension forces the heart to work harder to pump blood and may cause arteries to become narrow or stiff. Having untreated or uncontrolled hypertension can cause heart attacks, strokes, kidney disease, and other problems. A blood pressure reading consists of a higher number over a lower number. Ideally, your blood pressure should be below 120/80. The first ("top") number is called the systolic pressure. It is a measure of the pressure in your arteries as your heart beats. The second ("bottom") number is called the diastolic pressure. It is a measure of the pressure in your arteries as the heart relaxes. What are the causes? The cause of this condition is not known. What increases the risk? Some risk factors for high blood pressure are under your control. Others are not. Factors you can change  Smoking.  Having type 2 diabetes mellitus, high cholesterol, or both.  Not getting enough exercise or physical activity.  Being overweight.  Having too much fat, sugar, calories, or salt (sodium) in your diet.  Drinking too much alcohol. Factors that are difficult or impossible to change  Having chronic kidney disease.  Having a family history of high blood pressure.  Age. Risk increases with age.  Race. You may be at higher risk if you are African-American.  Gender. Men are at higher risk  than women before age 52. After age 52, women are at higher risk than men.  Having obstructive sleep apnea.  Stress. What are the signs or symptoms? Extremely high blood pressure (hypertensive crisis) may cause:  Headache.  Anxiety.  Shortness of breath.  Nosebleed.  Nausea and vomiting.  Severe chest pain.  Jerky movements you cannot control (seizures).  How is this diagnosed? This condition is diagnosed by measuring your blood pressure while you are seated, with your arm resting on a surface. The cuff of the blood pressure monitor will be placed directly against the skin of your upper arm at the level of your heart. It should be measured at least twice using the same arm. Certain conditions can cause a difference in blood pressure between your right and left arms. Certain factors can cause blood pressure readings to be lower or higher than normal (elevated) for a short period of time:  When your blood pressure is higher when you are in a health care provider's office than when you are at home, this is called white coat hypertension. Most people with this condition do not need medicines.  When your blood pressure is higher at home than when you are in a health care provider's office, this is called masked hypertension. Most people with this condition may need medicines to control blood pressure.  If you have a high blood pressure reading during one visit or you have normal blood pressure with other risk factors:  You may be asked to return on a different day to have your blood pressure  checked again.  You may be asked to monitor your blood pressure at home for 1 week or longer.  If you are diagnosed with hypertension, you may have other blood or imaging tests to help your health care provider understand your overall risk for other conditions. How is this treated? This condition is treated by making healthy lifestyle changes, such as eating healthy foods, exercising more, and  reducing your alcohol intake. Your health care provider may prescribe medicine if lifestyle changes are not enough to get your blood pressure under control, and if:  Your systolic blood pressure is above 130.  Your diastolic blood pressure is above 80.  Your personal target blood pressure may vary depending on your medical conditions, your age, and other factors. Follow these instructions at home: Eating and drinking  Eat a diet that is high in fiber and potassium, and low in sodium, added sugar, and fat. An example eating plan is called the DASH (Dietary Approaches to Stop Hypertension) diet. To eat this way: ? Eat plenty of fresh fruits and vegetables. Try to fill half of your plate at each meal with fruits and vegetables. ? Eat whole grains, such as whole wheat pasta, brown rice, or whole grain bread. Fill about one quarter of your plate with whole grains. ? Eat or drink low-fat dairy products, such as skim milk or low-fat yogurt. ? Avoid fatty cuts of meat, processed or cured meats, and poultry with skin. Fill about one quarter of your plate with lean proteins, such as fish, chicken without skin, beans, eggs, and tofu. ? Avoid premade and processed foods. These tend to be higher in sodium, added sugar, and fat.  Reduce your daily sodium intake. Most people with hypertension should eat less than 1,500 mg of sodium a day.  Limit alcohol intake to no more than 1 drink a day for nonpregnant women and 2 drinks a day for men. One drink equals 12 oz of beer, 5 oz of wine, or 1 oz of hard liquor. Lifestyle  Work with your health care provider to maintain a healthy body weight or to lose weight. Ask what an ideal weight is for you.  Get at least 30 minutes of exercise that causes your heart to beat faster (aerobic exercise) most days of the week. Activities may include walking, swimming, or biking.  Include exercise to strengthen your muscles (resistance exercise), such as pilates or lifting  weights, as part of your weekly exercise routine. Try to do these types of exercises for 30 minutes at least 3 days a week.  Do not use any products that contain nicotine or tobacco, such as cigarettes and e-cigarettes. If you need help quitting, ask your health care provider.  Monitor your blood pressure at home as told by your health care provider.  Keep all follow-up visits as told by your health care provider. This is important. Medicines  Take over-the-counter and prescription medicines only as told by your health care provider. Follow directions carefully. Blood pressure medicines must be taken as prescribed.  Do not skip doses of blood pressure medicine. Doing this puts you at risk for problems and can make the medicine less effective.  Ask your health care provider about side effects or reactions to medicines that you should watch for. Contact a health care provider if:  You think you are having a reaction to a medicine you are taking.  You have headaches that keep coming back (recurring).  You feel dizzy.  You have swelling in  your ankles.  You have trouble with your vision. Get help right away if:  You develop a severe headache or confusion.  You have unusual weakness or numbness.  You feel faint.  You have severe pain in your chest or abdomen.  You vomit repeatedly.  You have trouble breathing. Summary  Hypertension is when the force of blood pumping through your arteries is too strong. If this condition is not controlled, it may put you at risk for serious complications.  Your personal target blood pressure may vary depending on your medical conditions, your age, and other factors. For most people, a normal blood pressure is less than 120/80.  Hypertension is treated with lifestyle changes, medicines, or a combination of both. Lifestyle changes include weight loss, eating a healthy, low-sodium diet, exercising more, and limiting alcohol. This information is  not intended to replace advice given to you by your health care provider. Make sure you discuss any questions you have with your health care provider. Document Released: 01/22/2005 Document Revised: 12/21/2015 Document Reviewed: 12/21/2015 Elsevier Interactive Patient Education  2018 [employer].     Health Maintenance, Male A healthy lifestyle and preventive care is important for your health and wellness. Ask your health care provider about what schedule of regular examinations is right for you. What should I know about weight and diet? Eat a Healthy Diet  Eat plenty of vegetables, fruits, whole grains, low-fat dairy products, and lean protein.  Do not eat a lot of foods high in solid fats, added sugars, or salt.  Maintain a Healthy Weight Regular exercise can help you achieve or maintain a healthy weight. You should:  Do at least 150 minutes of exercise each week. The exercise should increase your heart rate and make you sweat (moderate-intensity exercise).  Do strength-training exercises at least twice a week.  Watch Your Levels of Cholesterol and Blood Lipids  Have your blood tested for lipids and cholesterol every 5 years starting at 52 years of age. If you are at high risk for heart disease, you should start having your blood tested when you are 52 years old. You may need to have your cholesterol levels checked more often if: ? Your lipid or cholesterol levels are high. ? You are older than 52 years of age. ? You are at high risk for heart disease.  What should I know about cancer screening? Many types of cancers can be detected early and may often be prevented. Lung Cancer  You should be screened every year for lung cancer if: ? You are a current smoker who has smoked for at least 30 years. ? You are a former smoker who has quit within the past 15 years.  Talk to your health care provider about your screening options, when you should start screening, and how often you  should be screened.  Colorectal Cancer  Routine colorectal cancer screening usually begins at 52 years of age and should be repeated every 5-10 years until you are 52 years old. You may need to be screened more often if early forms of precancerous polyps or small growths are found. Your health care provider may recommend screening at an earlier age if you have risk factors for colon cancer.  Your health care provider may recommend using home test kits to check for hidden blood in the stool.  A small camera at the end of a tube can be used to examine your colon (sigmoidoscopy or colonoscopy). This checks for the earliest forms of colorectal  cancer.  Prostate and Testicular Cancer  Depending on your age and overall health, your health care provider may do certain tests to screen for prostate and testicular cancer.  Talk to your health care provider about any symptoms or concerns you have about testicular or prostate cancer.  Skin Cancer  Check your skin from head to toe regularly.  Tell your health care provider about any new moles or changes in moles, especially if: ? There is a change in a mole's size, shape, or color. ? You have a mole that is larger than a pencil eraser.  Always use sunscreen. Apply sunscreen liberally and repeat throughout the day.  Protect yourself by wearing long sleeves, pants, a wide-brimmed hat, and sunglasses when outside.  What should I know about heart disease, diabetes, and high blood pressure?  If you are 54-28 years of age, have your blood pressure checked every 3-5 years. If you are 80 years of age or older, have your blood pressure checked every year. You should have your blood pressure measured twice-once when you are at a hospital or clinic, and once when you are not at a hospital or clinic. Record the average of the two measurements. To check your blood pressure when you are not at a hospital or clinic, you can use: ? An automated blood pressure  machine at a pharmacy. ? A home blood pressure monitor.  Talk to your health care provider about your target blood pressure.  If you are between 65-11 years old, ask your health care provider if you should take aspirin to prevent heart disease.  Have regular diabetes screenings by checking your fasting blood sugar level. ? If you are at a normal weight and have a low risk for diabetes, have this test once every three years after the age of 23. ? If you are overweight and have a high risk for diabetes, consider being tested at a younger age or more often.  A one-time screening for abdominal aortic aneurysm (AAA) by ultrasound is recommended for men aged 65-75 years who are current or former smokers. What should I know about preventing infection? Hepatitis B If you have a higher risk for hepatitis B, you should be screened for this virus. Talk with your health care provider to find out if you are at risk for hepatitis B infection. Hepatitis C Blood testing is recommended for:  Everyone born from [birth year] through 1965.  Anyone with known risk factors for hepatitis C.  Sexually Transmitted Diseases (STDs)  You should be screened each year for STDs including gonorrhea and chlamydia if: ? You are sexually active and are younger than 52 years of age. ? You are older than 52 years of age and your health care provider tells you that you are at risk for this type of infection. ? Your sexual activity has changed since you were last screened and you are at an increased risk for chlamydia or gonorrhea. Ask your health care provider if you are at risk.  Talk with your health care provider about whether you are at high risk of being infected with HIV. Your health care provider may recommend a prescription medicine to help prevent HIV infection.  What else can I do?  Schedule regular health, dental, and eye exams.  Stay current with your vaccines (immunizations).  Do not use any tobacco products,  such as cigarettes, chewing tobacco, and e-cigarettes. If you need help quitting, ask your health care provider.  Limit alcohol intake to no more  than 2 drinks per day. One drink equals 12 ounces of beer, 5 ounces of wine, or 1 ounces of hard liquor.  Do not use street drugs.  Do not share needles.  Ask your health care provider for help if you need support or information about quitting drugs.  Tell your health care provider if you often feel depressed.  Tell your health care provider if you have ever been abused or do not feel safe at home. This information is not intended to replace advice given to you by your health care provider. Make sure you discuss any questions you have with your health care provider. Document Released: 07/21/2007 Document Revised: 09/21/2015 Document Reviewed: 10/26/2014 Elsevier Interactive Patient Education  2018 [employer].     IF you received an x-ray today, you will receive an invoice from [facility]. Please contact [facility] at [phone] with questions or concerns regarding your invoice.   IF you received labwork today, you will receive an invoice from [place]. Please contact [facility] at [phone] with questions or concerns regarding your invoice.   Our billing staff will not be able to assist you with questions regarding bills from these companies.  You will be contacted with the lab results as soon as they are available. The fastest way to get your results is to activate your My Chart account. Instructions are located on the last page of this paperwork. If you have not heard from [place] regarding the results in 2 weeks, please contact this office.

## 2017-05-18 LAB — COMPREHENSIVE METABOLIC PANEL
ALK PHOS: 98 IU/L (ref 39–117)
ALT: 30 IU/L (ref 0–44)
AST: 22 IU/L (ref 0–40)
Albumin/Globulin Ratio: 1.5 (ref 1.2–2.2)
Albumin: 4.6 g/dL (ref 3.5–5.5)
BILIRUBIN TOTAL: 0.3 mg/dL (ref 0.0–1.2)
BUN / CREAT RATIO: 14 (ref 9–20)
BUN: 16 mg/dL (ref 6–24)
CHLORIDE: 97 mmol/L (ref 96–106)
CO2: 24 mmol/L (ref 20–29)
Calcium: 9.6 mg/dL (ref 8.7–10.2)
Creatinine, Ser: 1.12 mg/dL (ref 0.76–1.27)
GFR calc Af Amer: 87 mL/min/{1.73_m2} (ref >59)
GFR calc non Af Amer: 76 mL/min/{1.73_m2} (ref >59)
Globulin, Total: 3.1 g/dL (ref 1.5–4.5)
Glucose: 103 mg/dL — ABNORMAL HIGH (ref 65–99)
Potassium: 5.1 mmol/L (ref 3.5–5.2)
SODIUM: 139 mmol/L (ref 134–144)
Total Protein: 7.7 g/dL (ref 6.0–8.5)

## 2017-05-18 LAB — CBC
HEMOGLOBIN: 16.8 g/dL (ref 13.0–17.7)
Hematocrit: 49.3 % (ref 37.5–51.0)
MCH: 29.4 pg (ref 26.6–33.0)
MCHC: 34.1 g/dL (ref 31.5–35.7)
MCV: 86 fL (ref 79–97)
Platelets: 328 10*3/uL (ref 150–379)
RBC: 5.71 x10E6/uL (ref 4.14–5.80)
RDW: 12.4 % (ref 12.3–15.4)
WBC: 6.7 10*3/uL (ref 3.4–10.8)

## 2017-05-18 LAB — LIPID PANEL
CHOLESTEROL TOTAL: 220 mg/dL — AB (ref 100–199)
Chol/HDL Ratio: 4.7 ratio (ref 0.0–5.0)
HDL: 47 mg/dL (ref >39)
LDL CALC: 159 mg/dL — AB (ref 0–99)
TRIGLYCERIDES: 69 mg/dL (ref 0–149)
VLDL CHOLESTEROL CAL: 14 mg/dL (ref 5–40)

## 2017-05-18 LAB — HIV ANTIBODY (ROUTINE TESTING W REFLEX): HIV SCREEN 4TH GENERATION: NONREACTIVE

## 2017-05-18 LAB — TSH: TSH: 0.932 u[IU]/mL (ref 0.450–4.500)

## 2017-05-21 ENCOUNTER — Encounter: Payer: Self-pay | Admitting: Urgent Care

## 2017-06-21 ENCOUNTER — Encounter: Payer: Self-pay | Admitting: Urgent Care

## 2018-12-17 ENCOUNTER — Ambulatory Visit: Payer: PRIVATE HEALTH INSURANCE | Admitting: Registered Nurse

## 2018-12-17 ENCOUNTER — Other Ambulatory Visit: Payer: Self-pay

## 2018-12-17 ENCOUNTER — Encounter: Payer: Self-pay | Admitting: Registered Nurse

## 2018-12-17 VITALS — BP 183/83 | HR 77 | Temp 98.1°F | Resp 16 | Wt 182.0 lb

## 2018-12-17 DIAGNOSIS — L219 Seborrheic dermatitis, unspecified: Secondary | ICD-10-CM

## 2018-12-17 MED ORDER — KETOCONAZOLE 2 % EX SHAM: 1.0000 "application " | MEDICATED_SHAMPOO | CUTANEOUS | 0 refills | Status: DC

## 2018-12-17 NOTE — Patient Instructions (Signed)
° ° ° °  If you have lab work done today you will be contacted with your lab results within the next 2 weeks.  If you have not heard from us then please contact us. The fastest way to get your results is to register for My Chart. ° ° °IF you received an x-ray today, you will receive an invoice from [facility]. Please contact  Radiology at [phone] with questions or concerns regarding your invoice.  ° °IF you received labwork today, you will receive an invoice from [facility]. Please contact [facility] at [phone] with questions or concerns regarding your invoice.  ° °Our billing staff will not be able to assist you with questions regarding bills from these companies. ° °You will be contacted with the lab results as soon as they are available. The fastest way to get your results is to activate your My Chart account. Instructions are located on the last page of this paperwork. If you have not heard from us regarding the results in 2 weeks, please contact this office. °  ° ° ° °

## 2018-12-17 NOTE — Progress Notes (Signed)
Acute Office Visit  Subjective:    Patient ID: Christopher Case, male    DOB: July 09, 1965, 53 y.o.   MRN: [id number]  Chief Complaint  Patient presents with  . Rash    pt states for 3 weeks he has noticed a rash on the back of his neck and some on the back of his head. Pt states it comes and go and it is red and itchy.     HPI Patient is in today for rash on scalp  Started around 2-2.5 weeks ago. Has since largely resolved, but Pt is able to bring in video and pictures on his cell phone. Appears as an erythematous rash with some flaking skin. No clear acne or vesicles. No apparent drainage. Pt denies any change to shampoo, detergent, diet, medications, or otherwise. Rash was mildly itch and largely limited to the back of his head.  Past Medical History:  Diagnosis Date  . Allergic rhinitis   . HTN (hypertension)     Past Surgical History:  Procedure Laterality Date  . NO PAST SURGERIES      Family History  Problem Relation Age of Onset  . Hypertension Father   . Cancer Sister   . Allergic rhinitis Neg Hx   . Angioedema Neg Hx   . Asthma Neg Hx   . Atopy Neg Hx   . Eczema Neg Hx   . Immunodeficiency Neg Hx   . Urticaria Neg Hx     Social History   Socioeconomic History  . Marital status: Married    Spouse name: Not on file  . Number of children: Not on file  . Years of education: Not on file  . Highest education level: Not on file  Occupational History  . Not on file  Social Needs  . Financial resource strain: Not on file  . Food insecurity    Worry: Not on file    Inability: Not on file  . Transportation needs    Medical: Not on file    Non-medical: Not on file  Tobacco Use  . Smoking status: Never Smoker  . Smokeless tobacco: Never Used  Substance and Sexual Activity  . Alcohol use: No    Alcohol/week: 0.0 standard drinks  . Drug use: No  . Sexual activity: Yes  Lifestyle  . Physical activity    Days per week: Not on file    Minutes per session:  Not on file  . Stress: Not on file  Relationships  . Social Herbalist on phone: Not on file    Gets together: Not on file    Attends religious service: Not on file    Active member of club or organization: Not on file    Attends meetings of clubs or organizations: Not on file    Relationship status: Not on file  . Intimate partner violence    Fear of current or ex partner: Not on file    Emotionally abused: Not on file    Physically abused: Not on file    Forced sexual activity: Not on file  Other Topics Concern  . Not on file  Social History Narrative  . Not on file    Outpatient Medications Prior to Visit  Medication Sig Dispense Refill  . azelastine (OPTIVAR) 0.05 % ophthalmic solution Place 1 drop into both eyes 2 (two) times daily. 6 mL 12  . cetirizine (ZYRTEC) 10 MG tablet Take 1 tablet (10 mg total) by mouth daily. 90 tablet  3  . hydrochlorothiazide (HYDRODIURIL) 25 MG tablet Take 1 tablet (25 mg total) by mouth daily. 90 tablet 3  . lisinopril (PRINIVIL,ZESTRIL) 10 MG tablet Take 1 tablet (10 mg total) by mouth daily. 90 tablet 3  . trimethoprim-polymyxin b (POLYTRIM) ophthalmic solution Place 1 drop into both eyes every 4 (four) hours. 10 mL 0   No facility-administered medications prior to visit.     No Known Allergies  Review of Systems  Constitutional: Negative.   HENT: Negative.   Eyes: Negative.   Respiratory: Negative.   Cardiovascular: Negative.   Gastrointestinal: Negative.   Genitourinary: Negative.   Musculoskeletal: Negative.   Skin: Positive for itching and rash.       Scalp  Neurological: Negative.   Endo/Heme/Allergies: Negative.   Psychiatric/Behavioral: Negative.   All other systems reviewed and are negative.      Objective:    Physical Exam  Constitutional: He is oriented to person, place, and time. He appears well-developed and well-nourished. No distress.  Cardiovascular: Normal rate and regular rhythm.  Pulmonary/Chest:  Effort normal. No respiratory distress.  Neurological: He is oriented to person, place, and time.  Skin: Skin is warm and dry. No rash noted. He is not diaphoretic. No erythema. No pallor.  Psychiatric: He has a normal mood and affect. His behavior is normal. Judgment and thought content normal.  Nursing note and vitals reviewed.   BP (!) 183/83   Pulse 77   Temp 98.1 F (36.7 C) (Oral)   Resp 16   Wt 182 lb (82.6 kg)   SpO2 100%   BMI 26.88 kg/m  Wt Readings from Last 3 Encounters:  12/17/18 182 lb (82.6 kg)  05/17/17 180 lb (81.6 kg)  07/10/16 173 lb 9.6 oz (78.7 kg)    There are no preventive care reminders to display for this patient.  There are no preventive care reminders to display for this patient.   Lab Results  Component Value Date   TSH 0.932 05/17/2017   Lab Results  Component Value Date   WBC 6.7 05/17/2017   HGB 16.8 05/17/2017   HCT 49.3 05/17/2017   MCV 86 05/17/2017   PLT 328 05/17/2017   Lab Results  Component Value Date   NA 139 05/17/2017   K 5.1 05/17/2017   CO2 24 05/17/2017   GLUCOSE 103 (H) 05/17/2017   BUN 16 05/17/2017   CREATININE 1.12 05/17/2017   BILITOT 0.3 05/17/2017   ALKPHOS 98 05/17/2017   AST 22 05/17/2017   ALT 30 05/17/2017   PROT 7.7 05/17/2017   ALBUMIN 4.6 05/17/2017   CALCIUM 9.6 05/17/2017   Lab Results  Component Value Date   CHOL 220 (H) 05/17/2017   Lab Results  Component Value Date   HDL 47 05/17/2017   Lab Results  Component Value Date   LDLCALC 159 (H) 05/17/2017   Lab Results  Component Value Date   TRIG 69 05/17/2017   Lab Results  Component Value Date   CHOLHDL 4.7 05/17/2017   No results found for: HGBA1C     Assessment & Plan:   Problem List Items Addressed This Visit    None    Visit Diagnoses    Seborrheic dermatitis of scalp    -  Primary   Relevant Medications   ketoconazole (NIZORAL) 2 % shampoo (Start on 12/18/2018)       Meds ordered this encounter  Medications  .  ketoconazole (NIZORAL) 2 % shampoo    Sig: Apply 1  application topically 2 (two) times a week.    Dispense:  120 mL    Refill:  0    Order Specific Question:   Supervising Provider    Answer:   Doristine Bosworth [id number]   PLAN  No current rash and limited quality of images on phone, but appears as dermatitis of scalp. Will give Nizoral shampoo. Use as needed if rash recurs. Return to clinic if this is not effective  Patient encouraged to call clinic with any questions, comments, or concerns.    Janeece Agee, NP

## 2019-01-23 ENCOUNTER — Other Ambulatory Visit: Payer: Self-pay | Admitting: Registered Nurse

## 2019-01-23 DIAGNOSIS — L219 Seborrheic dermatitis, unspecified: Secondary | ICD-10-CM

## 2019-04-10 ENCOUNTER — Ambulatory Visit: Payer: PRIVATE HEALTH INSURANCE | Admitting: Adult Health Nurse Practitioner

## 2019-04-10 ENCOUNTER — Encounter: Payer: Self-pay | Admitting: Adult Health Nurse Practitioner

## 2019-04-10 ENCOUNTER — Other Ambulatory Visit: Payer: Self-pay

## 2019-04-10 VITALS — BP 200/90 | HR 60 | Temp 98.2°F | Ht 74.0 in | Wt 179.2 lb

## 2019-04-10 DIAGNOSIS — B353 Tinea pedis: Secondary | ICD-10-CM | POA: Diagnosis not present

## 2019-04-10 DIAGNOSIS — I10 Essential (primary) hypertension: Secondary | ICD-10-CM | POA: Diagnosis not present

## 2019-04-10 MED ORDER — CICLOPIROX 0.77 % EX GEL: 1.0000 "application " | Freq: Two times a day (BID) | CUTANEOUS | 1 refills | Status: DC

## 2019-04-10 MED ORDER — LOSARTAN POTASSIUM-HCTZ 100-25 MG PO TABS: 1.0000 | ORAL_TABLET | Freq: Every day | ORAL | 2 refills | Status: DC

## 2019-04-10 NOTE — Patient Instructions (Addendum)
   Managing Your Hypertension Hypertension is commonly called high blood pressure. This is when the force of your blood pressing against the walls of your arteries is too strong. Arteries are blood vessels that carry blood from your heart throughout your body. Hypertension forces the heart to work harder to pump blood, and may cause the arteries to become narrow or stiff. Having untreated or uncontrolled hypertension can cause heart attack, stroke, kidney disease, and other problems. What are blood pressure readings? A blood pressure reading consists of a higher number over a lower number. Ideally, your blood pressure should be below 120/80. The first ("top") number is called the systolic pressure. It is a measure of the pressure in your arteries as your heart beats. The second ("bottom") number is called the diastolic pressure. It is a measure of the pressure in your arteries as the heart relaxes. What does my blood pressure reading mean? Blood pressure is classified into four stages. Based on your blood pressure reading, your health care provider may use the following stages to determine what type of treatment you need, if any. Systolic pressure and diastolic pressure are measured in a unit called mm Hg. Normal  Systolic pressure: below 120.  Diastolic pressure: below 80. Elevated  Systolic pressure: 120-129.  Diastolic pressure: below 80. Hypertension stage 1  Systolic pressure: 130-139.  Diastolic pressure: 80-89. Hypertension stage 2  Systolic pressure: 140 or above.  Diastolic pressure: 90 or above. What health risks are associated with hypertension? Managing your hypertension is an important responsibility. Uncontrolled hypertension can lead to:  A heart attack.  A stroke.  A weakened blood vessel (aneurysm).  Heart failure.  Kidney damage.  Eye damage.  Metabolic syndrome.  Memory and concentration problems. What changes can I make to manage my  hypertension? Hypertension can be managed by making lifestyle changes and possibly by taking medicines. Your health care provider will help you make a plan to bring your blood pressure within a normal range. Eating and drinking   Eat a diet that is high in fiber and potassium, and low in salt (sodium), added sugar, and fat. An example eating plan is called the DASH (Dietary Approaches to Stop Hypertension) diet. To eat this way: ? Eat plenty of fresh fruits and vegetables. Try to fill half of your plate at each meal with fruits and vegetables. ? Eat whole grains, such as whole wheat pasta, brown rice, or whole grain bread. Fill about one quarter of your plate with whole grains. ? Eat low-fat diary products. ? Avoid fatty cuts of meat, processed or cured meats, and poultry with skin. Fill about one quarter of your plate with lean proteins such as fish, chicken without skin, beans, eggs, and tofu. ? Avoid premade and processed foods. These tend to be higher in sodium, added sugar, and fat.  Reduce your daily sodium intake. Most people with hypertension should eat less than 1,500 mg of sodium a day.  Limit alcohol intake to no more than 1 drink a day for nonpregnant women and 2 drinks a day for men. One drink equals 12 oz of beer, 5 oz of wine, or 1 oz of hard liquor. Lifestyle  Work with your health care provider to maintain a healthy body weight, or to lose weight. Ask what an ideal weight is for you.  Get at least 30 minutes of exercise that causes your heart to beat faster (aerobic exercise) most days of the week. Activities may include walking, swimming, or biking.    Include exercise to strengthen your muscles (resistance exercise), such as weight lifting, as part of your weekly exercise routine. Try to do these types of exercises for 30 minutes at least 3 days a week.  Do not use any products that contain nicotine or tobacco, such as cigarettes and e-cigarettes. If you need help quitting,  ask your health care provider.  Control any long-term (chronic) conditions you have, such as high cholesterol or diabetes. Monitoring  Monitor your blood pressure at home as told by your health care provider. Your personal target blood pressure may vary depending on your medical conditions, your age, and other factors.  Have your blood pressure checked regularly, as often as told by your health care provider. Working with your health care provider  Review all the medicines you take with your health care provider because there may be side effects or interactions.  Talk with your health care provider about your diet, exercise habits, and other lifestyle factors that may be contributing to hypertension.  Visit your health care provider regularly. Your health care provider can help you create and adjust your plan for managing hypertension. Will I need medicine to control my blood pressure? Your health care provider may prescribe medicine if lifestyle changes are not enough to get your blood pressure under control, and if:  Your systolic blood pressure is 130 or higher.  Your diastolic blood pressure is 80 or higher. Take medicines only as told by your health care provider. Follow the directions carefully. Blood pressure medicines must be taken as prescribed. The medicine does not work as well when you skip doses. Skipping doses also puts you at risk for problems. Contact a health care provider if:  You think you are having a reaction to medicines you have taken.  You have repeated (recurrent) headaches.  You feel dizzy.  You have swelling in your ankles.  You have trouble with your vision. Get help right away if:  You develop a severe headache or confusion.  You have unusual weakness or numbness, or you feel faint.  You have severe pain in your chest or abdomen.  You vomit repeatedly.  You have trouble breathing. Summary  Hypertension is when the force of blood pumping  through your arteries is too strong. If this condition is not controlled, it may put you at risk for serious complications.  Your personal target blood pressure may vary depending on your medical conditions, your age, and other factors. For most people, a normal blood pressure is less than 120/80.  Hypertension is managed by lifestyle changes, medicines, or both. Lifestyle changes include weight loss, eating a healthy, low-sodium diet, exercising more, and limiting alcohol. This information is not intended to replace advice given to you by your health care provider. Make sure you discuss any questions you have with your health care provider. Document Revised: 05/16/2018 Document Reviewed: 12/21/2015 Elsevier Patient Education  2020 [employer]  

## 2019-04-10 NOTE — Progress Notes (Signed)
Chief Complaint  Patient presents with  . Rash    Pt stated that a rash/on his feet for the past 2 mos.    HPI   Patient presents for tinea pedis.  He has had this in the past and used over-the-counter creams which improved the problem but it has returned.  It is on his left foot covering phalanges 2 through 4 and on the dorsal aspect of his foot.  It is very itchy.  It has been there for the past 1 to 2 months.  Patient's blood pressure on arrival was over 938 systolic.  Several following blood pressures have been urgently elevated.  He denies any chest pressure, chest pain, shortness of breath, blurry vision, or headache.  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  They split as much of the care for them as possible.  However, he is always tired and there always seems to be something to do.  He does have a history of hypertension, previously controlled on a small dose of hydrochlorothiazide and lisinopril.    Problem List    Problem List: 2021-03: Tinea pedis of left foot Essential hypertension Allergic rhinitis   Allergies   has No Known Allergies.  Medications   Current Meds  Medication Sig  . azelastine (OPTIVAR) 0.05 % ophthalmic solution Place 1 drop into both eyes 2 (two) times daily.  . cetirizine (ZYRTEC) 10 MG tablet Take 1 tablet (10 mg total) by mouth daily.  . hydrochlorothiazide (HYDRODIURIL) 25 MG tablet Take 1 tablet (25 mg total) by mouth daily.  Marland Kitchen ketoconazole (NIZORAL) 2 % shampoo APPLY TO AFFECTED AREA TOPICALLY 2 (TWO) TIMES A WEEK.  Marland Kitchen lisinopril (PRINIVIL,ZESTRIL) 10 MG tablet Take 1 tablet (10 mg total) by mouth daily.  Marland Kitchen trimethoprim-polymyxin b (POLYTRIM) ophthalmic solution Place 1 drop into both eyes every 4 (four) hours.     Review of Systems    Constitutional: Negative for activity change, appetite change, chills and fever.    HENT: Negative for congestion, nosebleeds, trouble swallowing and voice change.   Respiratory: Negative for cough, shortness of breath and wheezing.   Cardiac:  Negative for chest pain, pressure, syncope  Gastrointestinal: Negative for diarrhea, nausea and vomiting.  Genitourinary: Negative for difficulty urinating, dysuria, flank pain and hematuria.  Musculoskeletal: Negative for back pain, joint swelling and neck pain.  Neurological: Negative for dizziness, speech difficulty, light-headedness and numbness.  See HPI. All other review of systems negative.     Physical Exam:    height is '6\' 2"'  (1.88 m) and weight is 179 lb 3.2 oz (81.3 kg). His temporal temperature is 98.2 F (36.8 C). His blood pressure is 219/91 (abnormal) and his pulse is 60. His oxygen saturation is 98%.   Physical Examination: General appearance - alert, well appearing, and in no distress and oriented to person, place, and time Mental status - normal mood, behavior, speech, dress, motor activity, and thought processes Eyes - PERRL. Extraocular movements intact.  No nystagmus.  Neck - supple, no significant adenopathy, carotids upstroke normal bilaterally, no bruits, thyroid exam: thyroid is normal in size without nodules or tenderness Chest - clear to auscultation, no wheezes, rales or rhonchi, symmetric air entry  Heart - normal rate, regular rhythm, normal S1, S2, no murmurs, rubs, clicks or gallops Extremities - dependent LE edema without clubbing or cyanosis Skin -flaky patches  of skin noted on left foot, toes 2-4 , no drainage or warmth  Lab /Imaging Review    orders written for new lab studies as appropriate; see orders.   Assessment & Plan:  Christopher Case is a 54 y.o. male    1. Essential hypertension   2. Tinea pedis of left foot    Orders Placed This Encounter  Procedures  . CMP14+EGFR  . TSH   Meds ordered this encounter  Medications  . losartan-hydrochlorothiazide (HYZAAR) 100-25 MG tablet     Sig: Take 1 tablet by mouth daily.    Dispense:  30 tablet    Refill:  2  . Ciclopirox 0.77 % gel    Sig: Apply 1 application topically 2 (two) times daily.    Dispense:  45 g    Refill:  1    Follow up one month.  Advised checking Bps weekly here at clinic.  For any cardiac symptoms, go to ER.  Verbalized understanding.     Glyn Ade, NP

## 2019-04-11 LAB — CMP14+EGFR
ALT: 17 IU/L (ref 0–44)
AST: 16 IU/L (ref 0–40)
Albumin/Globulin Ratio: 1.6 (ref 1.2–2.2)
Albumin: 4.1 g/dL (ref 3.8–4.9)
Alkaline Phosphatase: 94 IU/L (ref 39–117)
BUN/Creatinine Ratio: 13 (ref 9–20)
BUN: 14 mg/dL (ref 6–24)
Bilirubin Total: 0.4 mg/dL (ref 0.0–1.2)
CO2: 21 mmol/L (ref 20–29)
Calcium: 8.6 mg/dL — ABNORMAL LOW (ref 8.7–10.2)
Chloride: 105 mmol/L (ref 96–106)
Creatinine, Ser: 1.08 mg/dL (ref 0.76–1.27)
GFR calc Af Amer: 90 mL/min/{1.73_m2} (ref >59)
GFR calc non Af Amer: 78 mL/min/{1.73_m2} (ref >59)
Globulin, Total: 2.5 g/dL (ref 1.5–4.5)
Glucose: 104 mg/dL — ABNORMAL HIGH (ref 65–99)
Potassium: 4.4 mmol/L (ref 3.5–5.2)
Sodium: 141 mmol/L (ref 134–144)
Total Protein: 6.6 g/dL (ref 6.0–8.5)

## 2019-04-11 LAB — TSH: TSH: 0.647 u[IU]/mL (ref 0.450–4.500)

## 2019-05-08 ENCOUNTER — Ambulatory Visit: Payer: PRIVATE HEALTH INSURANCE

## 2019-05-19 ENCOUNTER — Other Ambulatory Visit: Payer: Self-pay

## 2019-05-19 ENCOUNTER — Ambulatory Visit (INDEPENDENT_AMBULATORY_CARE_PROVIDER_SITE_OTHER): Payer: PRIVATE HEALTH INSURANCE | Admitting: Registered Nurse

## 2019-05-19 VITALS — BP 141/77 | HR 74

## 2019-05-19 DIAGNOSIS — Z0130 Encounter for examination of blood pressure without abnormal findings: Secondary | ICD-10-CM

## 2019-05-19 NOTE — Progress Notes (Signed)
Patient was here for blood pressure check only, per Janne Lab, NP.

## 2019-05-19 NOTE — Addendum Note (Signed)
Addended by: Georg Ruddle A on: 05/19/2019 08:38 AM   Modules accepted: Level of Service

## 2019-07-09 ENCOUNTER — Other Ambulatory Visit: Payer: Self-pay

## 2019-07-09 DIAGNOSIS — I10 Essential (primary) hypertension: Secondary | ICD-10-CM

## 2019-07-09 MED ORDER — LOSARTAN POTASSIUM-HCTZ 100-25 MG PO TABS: 1.0000 | ORAL_TABLET | Freq: Every day | ORAL | 2 refills | Status: DC

## 2019-10-31 ENCOUNTER — Other Ambulatory Visit: Payer: Self-pay | Admitting: Family Medicine

## 2019-10-31 DIAGNOSIS — I10 Essential (primary) hypertension: Secondary | ICD-10-CM

## 2019-10-31 NOTE — Telephone Encounter (Signed)
Requested  medications are  due for refill today yes  Requested medications are on the active medication list yes   Notes to clinic losartan-hydrochlorathiazide and lisinopril shows caution, please assess if pt currently on both.

## 2019-12-15 ENCOUNTER — Other Ambulatory Visit: Payer: Self-pay

## 2019-12-15 ENCOUNTER — Encounter: Payer: Self-pay | Admitting: Registered Nurse

## 2019-12-15 ENCOUNTER — Ambulatory Visit: Payer: PRIVATE HEALTH INSURANCE | Admitting: Registered Nurse

## 2019-12-15 VITALS — BP 170/82 | HR 77 | Temp 98.0°F | Resp 18 | Ht 74.0 in | Wt 180.4 lb

## 2019-12-15 DIAGNOSIS — I10 Essential (primary) hypertension: Secondary | ICD-10-CM | POA: Diagnosis not present

## 2019-12-15 DIAGNOSIS — J209 Acute bronchitis, unspecified: Secondary | ICD-10-CM | POA: Diagnosis not present

## 2019-12-15 MED ORDER — PREDNISONE 10 MG PO TABS: ORAL_TABLET | ORAL | 0 refills | Status: AC

## 2019-12-15 MED ORDER — MONTELUKAST SODIUM 10 MG PO TABS: 10.0000 mg | ORAL_TABLET | Freq: Every day | ORAL | 3 refills | Status: DC

## 2019-12-15 MED ORDER — LOSARTAN POTASSIUM-HCTZ 100-25 MG PO TABS: 1.0000 | ORAL_TABLET | Freq: Every day | ORAL | 0 refills | Status: DC

## 2019-12-15 NOTE — Patient Instructions (Addendum)
   If you have lab work done today you will be contacted with your lab results within the next 2 weeks.  If you have not heard from us then please contact us. The fastest way to get your results is to register for My Chart.   IF you received an x-ray today, you will receive an invoice from [facility]. Please contact [facility] at [phone] with questions or concerns regarding your invoice.   IF you received labwork today, you will receive an invoice from [facility]. Please contact [facility] at [phone] with questions or concerns regarding your invoice.   Our billing staff will not be able to assist you with questions regarding bills from these companies.  You will be contacted with the lab results as soon as they are available. The fastest way to get your results is to activate your My Chart account. Instructions are located on the last page of this paperwork. If you have not heard from us regarding the results in 2 weeks, please contact this office.      Hypertension, Adult High blood pressure (hypertension) is when the force of blood pumping through the arteries is too strong. The arteries are the blood vessels that carry blood from the heart throughout the body. Hypertension forces the heart to work harder to pump blood and may cause arteries to become narrow or stiff. Untreated or uncontrolled hypertension can cause a heart attack, heart failure, a stroke, kidney disease, and other problems. A blood pressure reading consists of a higher number over a lower number. Ideally, your blood pressure should be below 120/80. The first ("top") number is called the systolic pressure. It is a measure of the pressure in your arteries as your heart beats. The second ("bottom") number is called the diastolic pressure. It is a measure of the pressure in your arteries as the heart relaxes. What are the causes? The exact cause of this condition is not known. There are some conditions  that result in or are related to high blood pressure. What increases the risk? Some risk factors for high blood pressure are under your control. The following factors may make you more likely to develop this condition:  Smoking.  Having type 2 diabetes mellitus, high cholesterol, or both.  Not getting enough exercise or physical activity.  Being overweight.  Having too much fat, sugar, calories, or salt (sodium) in your diet.  Drinking too much alcohol. Some risk factors for high blood pressure may be difficult or impossible to change. Some of these factors include:  Having chronic kidney disease.  Having a family history of high blood pressure.  Age. Risk increases with age.  Race. You may be at higher risk if you are African American.  Gender. Men are at higher risk than women before age 45. After age 65, women are at higher risk than men.  Having obstructive sleep apnea.  Stress. What are the signs or symptoms? High blood pressure may not cause symptoms. Very high blood pressure (hypertensive crisis) may cause:  Headache.  Anxiety.  Shortness of breath.  Nosebleed.  Nausea and vomiting.  Vision changes.  Severe chest pain.  Seizures. How is this diagnosed? This condition is diagnosed by measuring your blood pressure while you are seated, with your arm resting on a flat surface, your legs uncrossed, and your feet flat on the floor. The cuff of the blood pressure monitor will be placed directly against the skin of your upper arm at the level of your   heart. It should be measured at least twice using the same arm. Certain conditions can cause a difference in blood pressure between your right and left arms. Certain factors can cause blood pressure readings to be lower or higher than normal for a short period of time:  When your blood pressure is higher when you are in a health care provider's office than when you are at home, this is called white coat hypertension.  Most people with this condition do not need medicines.  When your blood pressure is higher at home than when you are in a health care provider's office, this is called masked hypertension. Most people with this condition may need medicines to control blood pressure. If you have a high blood pressure reading during one visit or you have normal blood pressure with other risk factors, you may be asked to:  Return on a different day to have your blood pressure checked again.  Monitor your blood pressure at home for 1 week or longer. If you are diagnosed with hypertension, you may have other blood or imaging tests to help your health care provider understand your overall risk for other conditions. How is this treated? This condition is treated by making healthy lifestyle changes, such as eating healthy foods, exercising more, and reducing your alcohol intake. Your health care provider may prescribe medicine if lifestyle changes are not enough to get your blood pressure under control, and if:  Your systolic blood pressure is above 130.  Your diastolic blood pressure is above 80. Your personal target blood pressure may vary depending on your medical conditions, your age, and other factors. Follow these instructions at home: Eating and drinking   Eat a diet that is high in fiber and potassium, and low in sodium, added sugar, and fat. An example eating plan is called the DASH (Dietary Approaches to Stop Hypertension) diet. To eat this way: ? Eat plenty of fresh fruits and vegetables. Try to fill one half of your plate at each meal with fruits and vegetables. ? Eat whole grains, such as whole-wheat pasta, brown rice, or whole-grain bread. Fill about one fourth of your plate with whole grains. ? Eat or drink low-fat dairy products, such as skim milk or low-fat yogurt. ? Avoid fatty cuts of meat, processed or cured meats, and poultry with skin. Fill about one fourth of your plate with lean proteins, such  as fish, chicken without skin, beans, eggs, or tofu. ? Avoid pre-made and processed foods. These tend to be higher in sodium, added sugar, and fat.  Reduce your daily sodium intake. Most people with hypertension should eat less than 1,500 mg of sodium a day.  Do not drink alcohol if: ? Your health care provider tells you not to drink. ? You are pregnant, may be pregnant, or are planning to become pregnant.  If you drink alcohol: ? Limit how much you use to:  0-1 drink a day for women.  0-2 drinks a day for men. ? Be aware of how much alcohol is in your drink. In the [place] one drink equals one 12 oz bottle of beer (355 mL), one 5 oz glass of wine (148 mL), or one 1 oz glass of hard liquor (44 mL). Lifestyle   Work with your health care provider to maintain a healthy body weight or to lose weight. Ask what an ideal weight is for you.  Get at least 30 minutes of exercise most days of the week. Activities may include walking, swimming,   or biking.  Include exercise to strengthen your muscles (resistance exercise), such as Pilates or lifting weights, as part of your weekly exercise routine. Try to do these types of exercises for 30 minutes at least 3 days a week.  Do not use any products that contain nicotine or tobacco, such as cigarettes, e-cigarettes, and chewing tobacco. If you need help quitting, ask your health care provider.  Monitor your blood pressure at home as told by your health care provider.  Keep all follow-up visits as told by your health care provider. This is important. Medicines  Take over-the-counter and prescription medicines only as told by your health care provider. Follow directions carefully. Blood pressure medicines must be taken as prescribed.  Do not skip doses of blood pressure medicine. Doing this puts you at risk for problems and can make the medicine less effective.  Ask your health care provider about side effects or reactions to medicines that you  should watch for. Contact a health care provider if you:  Think you are having a reaction to a medicine you are taking.  Have headaches that keep coming back (recurring).  Feel dizzy.  Have swelling in your ankles.  Have trouble with your vision. Get help right away if you:  Develop a severe headache or confusion.  Have unusual weakness or numbness.  Feel faint.  Have severe pain in your chest or abdomen.  Vomit repeatedly.  Have trouble breathing. Summary  Hypertension is when the force of blood pumping through your arteries is too strong. If this condition is not controlled, it may put you at risk for serious complications.  Your personal target blood pressure may vary depending on your medical conditions, your age, and other factors. For most people, a normal blood pressure is less than 120/80.  Hypertension is treated with lifestyle changes, medicines, or a combination of both. Lifestyle changes include losing weight, eating a healthy, low-sodium diet, exercising more, and limiting alcohol. This information is not intended to replace advice given to you by your health care provider. Make sure you discuss any questions you have with your health care provider. Document Revised: 10/02/2017 Document Reviewed: 10/02/2017 Elsevier Patient Education  2020 Elsevier Inc.  Preventing Hypertension Hypertension, commonly called high blood pressure, is when the force of blood pumping through the arteries is too strong. Arteries are blood vessels that carry blood from the heart throughout the body. Over time, hypertension can damage the arteries and decrease blood flow to important parts of the body, including the brain, heart, and kidneys. Often, hypertension does not cause symptoms until blood pressure is very high. For this reason, it is important to have your blood pressure checked on a regular basis. Hypertension can often be prevented with diet and lifestyle changes. If you already  have hypertension, you can control it with diet and lifestyle changes, as well as medicine. What nutrition changes can be made? Maintain a healthy diet. This includes:  Eating less salt (sodium). Ask your health care provider how much sodium is safe for you to have. The general recommendation is to consume less than 1 tsp (2,300 mg) of sodium a day. ? Do not add salt to your food. ? Choose low-sodium options when grocery shopping and eating out.  Limiting fats in your diet. You can do this by eating low-fat or fat-free dairy products and by eating less red meat.  Eating more fruits, vegetables, and whole grains. Make a goal to eat: ? 1-2 cups of fresh fruits and  vegetables each day. ? 3-4 servings of whole grains each day.  Avoiding foods and beverages that have added sugars.  Eating fish that contain healthy fats (omega-3 fatty acids), such as mackerel or salmon. If you need help putting together a healthy eating plan, try the DASH diet. This diet is high in fruits, vegetables, and whole grains. It is low in sodium, red meat, and added sugars. DASH stands for Dietary Approaches to Stop Hypertension. What lifestyle changes can be made?   Lose weight if you are overweight. Losing just 3?5% of your body weight can help prevent or control hypertension. ? For example, if your present weight is 200 lb (91 kg), a loss of 3-5% of your weight means losing 6-10 lb (2.7-4.5 kg). ? Ask your health care provider to help you with a diet and exercise plan to safely lose weight.  Get enough exercise. Do at least 150 minutes of moderate-intensity exercise each week. ? You could do this in short exercise sessions several times a day, or you could do longer exercise sessions a few times a week. For example, you could take a brisk 10-minute walk or bike ride, 3 times a day, for 5 days a week.  Find ways to reduce stress, such as exercising, meditating, listening to music, or taking a yoga class. If you  need help reducing stress, ask your health care provider.  Do not smoke. This includes e-cigarettes. Chemicals in tobacco and nicotine products raise your blood pressure each time you smoke. If you need help quitting, ask your health care provider.  Avoid alcohol. If you drink alcohol, limit alcohol intake to no more than 1 drink a day for nonpregnant women and 2 drinks a day for men. One drink equals 12 oz of beer, 5 oz of wine, or 1 oz of hard liquor. Why are these changes important? Diet and lifestyle changes can help you prevent hypertension, and they may make you feel better overall and improve your quality of life. If you have hypertension, making these changes will help you control it and help prevent major complications, such as:  Hardening and narrowing of arteries that supply blood to: ? Your heart. This can cause a heart attack. ? Your brain. This can cause a stroke. ? Your kidneys. This can cause kidney failure.  Stress on your heart muscle, which can cause heart failure. What can I do to lower my risk?  Work with your health care provider to make a hypertension prevention plan that works for you. Follow your plan and keep all follow-up visits as told by your health care provider.  Learn how to check your blood pressure at home. Make sure that you know your personal target blood pressure, as told by your health care provider. How is this treated? In addition to diet and lifestyle changes, your health care provider may recommend medicines to help lower your blood pressure. You may need to try a few different medicines to find what works best for you. You also may need to take more than one medicine. Take over-the-counter and prescription medicines only as told by your health care provider. Where to find support Your health care provider can help you prevent hypertension and help you keep your blood pressure at a healthy level. Your local hospital or your community may also provide  support services and prevention programs. The American Heart Association offers an online support network at: https://www.lee.net/ Where to find more information Learn more about hypertension from:  [employer],  Lung, and Blood Institute: https://www.peterson.org/www.nhlbi.nih.gov/health/health-topics/topics/hbp  Centers for Disease Control and Prevention: AboutHD.co.nzwww.cdc.gov/bloodpressure  American Academy of Family Physicians: http://familydoctor.org/familydoctor/en/diseases-conditions/high-blood-pressure.printerview.all.html Learn more about the DASH diet from:  National Heart, Lung, and Blood Institute: WedMap.itwww.nhlbi.nih.gov/health/health-topics/topics/dash Contact a health care provider if:  You think you are having a reaction to medicines you have taken.  You have recurrent headaches or feel dizzy.  You have swelling in your ankles.  You have trouble with your vision. Summary  Hypertension often does not cause any symptoms until blood pressure is very high. It is important to get your blood pressure checked regularly.  Diet and lifestyle changes are the most important steps in preventing hypertension.  By keeping your blood pressure in a healthy range, you can prevent complications like heart attack, heart failure, stroke, and kidney failure.  Work with your health care provider to make a hypertension prevention plan that works for you. This information is not intended to replace advice given to you by your health care provider. Make sure you discuss any questions you have with your health care provider. Document Revised: 05/16/2018 Document Reviewed: 10/03/2015 Elsevier Patient Education  2020 [employer]  Managing Your Hypertension Hypertension is commonly called high blood pressure. This is when the force of your blood pressing against the walls of your arteries is too strong. Arteries are blood vessels that carry blood from your heart throughout your body. Hypertension  forces the heart to work harder to pump blood, and may cause the arteries to become narrow or stiff. Having untreated or uncontrolled hypertension can cause heart attack, stroke, kidney disease, and other problems. What are blood pressure readings? A blood pressure reading consists of a higher number over a lower number. Ideally, your blood pressure should be below 120/80. The first ("top") number is called the systolic pressure. It is a measure of the pressure in your arteries as your heart beats. The second ("bottom") number is called the diastolic pressure. It is a measure of the pressure in your arteries as the heart relaxes. What does my blood pressure reading mean? Blood pressure is classified into four stages. Based on your blood pressure reading, your health care provider may use the following stages to determine what type of treatment you need, if any. Systolic pressure and diastolic pressure are measured in a unit called mm Hg. Normal  Systolic pressure: below 120.  Diastolic pressure: below 80. Elevated  Systolic pressure: 120-129.  Diastolic pressure: below 80. Hypertension stage 1  Systolic pressure: 130-139.  Diastolic pressure: 80-89. Hypertension stage 2  Systolic pressure: 140 or above.  Diastolic pressure: 90 or above. What health risks are associated with hypertension? Managing your hypertension is an important responsibility. Uncontrolled hypertension can lead to:  A heart attack.  A stroke.  A weakened blood vessel (aneurysm).  Heart failure.  Kidney damage.  Eye damage.  Metabolic syndrome.  Memory and concentration problems. What changes can I make to manage my hypertension? Hypertension can be managed by making lifestyle changes and possibly by taking medicines. Your health care provider will help you make a plan to bring your blood pressure within a normal range. Eating and drinking   Eat a diet that is high in fiber and potassium, and low in  salt (sodium), added sugar, and fat. An example eating plan is called the DASH (Dietary Approaches to Stop Hypertension) diet. To eat this way: ? Eat plenty of fresh fruits and vegetables. Try to fill half of your plate at each meal with fruits and vegetables. ?  Eat whole grains, such as whole wheat pasta, brown rice, or whole grain bread. Fill about one quarter of your plate with whole grains. ? Eat low-fat diary products. ? Avoid fatty cuts of meat, processed or cured meats, and poultry with skin. Fill about one quarter of your plate with lean proteins such as fish, chicken without skin, beans, eggs, and tofu. ? Avoid premade and processed foods. These tend to be higher in sodium, added sugar, and fat.  Reduce your daily sodium intake. Most people with hypertension should eat less than 1,500 mg of sodium a day.  Limit alcohol intake to no more than 1 drink a day for nonpregnant women and 2 drinks a day for men. One drink equals 12 oz of beer, 5 oz of wine, or 1 oz of hard liquor. Lifestyle  Work with your health care provider to maintain a healthy body weight, or to lose weight. Ask what an ideal weight is for you.  Get at least 30 minutes of exercise that causes your heart to beat faster (aerobic exercise) most days of the week. Activities may include walking, swimming, or biking.  Include exercise to strengthen your muscles (resistance exercise), such as weight lifting, as part of your weekly exercise routine. Try to do these types of exercises for 30 minutes at least 3 days a week.  Do not use any products that contain nicotine or tobacco, such as cigarettes and e-cigarettes. If you need help quitting, ask your health care provider.  Control any long-term (chronic) conditions you have, such as high cholesterol or diabetes. Monitoring  Monitor your blood pressure at home as told by your health care provider. Your personal target blood pressure may vary depending on your medical  conditions, your age, and other factors.  Have your blood pressure checked regularly, as often as told by your health care provider. Working with your health care provider  Review all the medicines you take with your health care provider because there may be side effects or interactions.  Talk with your health care provider about your diet, exercise habits, and other lifestyle factors that may be contributing to hypertension.  Visit your health care provider regularly. Your health care provider can help you create and adjust your plan for managing hypertension. Will I need medicine to control my blood pressure? Your health care provider may prescribe medicine if lifestyle changes are not enough to get your blood pressure under control, and if:  Your systolic blood pressure is 130 or higher.  Your diastolic blood pressure is 80 or higher. Take medicines only as told by your health care provider. Follow the directions carefully. Blood pressure medicines must be taken as prescribed. The medicine does not work as well when you skip doses. Skipping doses also puts you at risk for problems. Contact a health care provider if:  You think you are having a reaction to medicines you have taken.  You have repeated (recurrent) headaches.  You feel dizzy.  You have swelling in your ankles.  You have trouble with your vision. Get help right away if:  You develop a severe headache or confusion.  You have unusual weakness or numbness, or you feel faint.  You have severe pain in your chest or abdomen.  You vomit repeatedly.  You have trouble breathing. Summary  Hypertension is when the force of blood pumping through your arteries is too strong. If this condition is not controlled, it may put you at risk for serious complications.  Your personal  target blood pressure may vary depending on your medical conditions, your age, and other factors. For most people, a normal blood pressure is less  than 120/80.  Hypertension is managed by lifestyle changes, medicines, or both. Lifestyle changes include weight loss, eating a healthy, low-sodium diet, exercising more, and limiting alcohol. This information is not intended to replace advice given to you by your health care provider. Make sure you discuss any questions you have with your health care provider. Document Revised: 05/16/2018 Document Reviewed: 12/21/2015 Elsevier Patient Education  2020 [employer].

## 2019-12-15 NOTE — Progress Notes (Signed)
Established Patient Office Visit  Subjective:  Patient ID: Christopher Case, male    DOB: Jan 07, 1966  Age: 54 y.o. MRN: [id number]  CC:  Chief Complaint  Patient presents with   Medication Refill    medication refill for losartan. Patient also wants to discuss something for chest to help him sleep,    HPI [name] presents for medication refill and chest congestion  HTN: has been elevated for some time. Was started on losartan-HCTZ by Janne Lab. Unfortunately did not return for BP check or follow up visit. Has run out for around 2 months now. No CV symptoms. Feeling well overall. Still high stress at home and work  Chest congestion: no shob, doe, chest pain, palpitations. Does note some productive cough, worse in mornings, always happens around this time of year. Usually bronchitis treated well with brief course of steroids. Sometimes antihistamines or OTC claritin  Otherwise feeling well no further complaints at this time.    Past Medical History:  Diagnosis Date   Allergic rhinitis    HTN (hypertension)     Past Surgical History:  Procedure Laterality Date   NO PAST SURGERIES      Family History  Problem Relation Age of Onset   Hypertension Father    Cancer Sister    Allergic rhinitis Neg Hx    Angioedema Neg Hx    Asthma Neg Hx    Atopy Neg Hx    Eczema Neg Hx    Immunodeficiency Neg Hx    Urticaria Neg Hx     Social History   Socioeconomic History   Marital status: Married    Spouse name: Not on file   Number of children: Not on file   Years of education: Not on file   Highest education level: Not on file  Occupational History   Not on file  Tobacco Use   Smoking status: Never Smoker   Smokeless tobacco: Never Used  Substance and Sexual Activity   Alcohol use: No    Alcohol/week: 0.0 standard drinks   Drug use: No   Sexual activity: Yes  Other Topics Concern   Not on file  Social History Narrative   Not on file    Social Determinants of Health   Financial Resource Strain:    Difficulty of Paying Living Expenses: Not on file  Food Insecurity:    Worried About Running Out of Food in the Last Year: Not on file   Ran Out of Food in the Last Year: Not on file  Transportation Needs:    Lack of Transportation (Medical): Not on file   Lack of Transportation (Non-Medical): Not on file  Physical Activity:    Days of Exercise per Week: Not on file   Minutes of Exercise per Session: Not on file  Stress:    Feeling of Stress : Not on file  Social Connections:    Frequency of Communication with Friends and Family: Not on file   Frequency of Social Gatherings with Friends and Family: Not on file   Attends Religious Services: Not on file   Active Member of Clubs or Organizations: Not on file   Attends Banker Meetings: Not on file   Marital Status: Not on file  Intimate Partner Violence:    Fear of Current or Ex-Partner: Not on file   Emotionally Abused: Not on file   Physically Abused: Not on file   Sexually Abused: Not on file    Outpatient Medications Prior to Visit  Medication Sig Dispense Refill   losartan-hydrochlorothiazide (HYZAAR) 100-25 MG tablet Take 1 tablet by mouth daily. 30 tablet 2   azelastine (OPTIVAR) 0.05 % ophthalmic solution Place 1 drop into both eyes 2 (two) times daily. (Patient not taking: Reported on 12/15/2019) 6 mL 12   cetirizine (ZYRTEC) 10 MG tablet Take 1 tablet (10 mg total) by mouth daily. (Patient not taking: Reported on 12/15/2019) 90 tablet 3   Ciclopirox 0.77 % gel Apply 1 application topically 2 (two) times daily. (Patient not taking: Reported on 12/15/2019) 45 g 1   hydrochlorothiazide (HYDRODIURIL) 25 MG tablet Take 1 tablet (25 mg total) by mouth daily. (Patient not taking: Reported on 12/15/2019) 90 tablet 3   ketoconazole (NIZORAL) 2 % shampoo APPLY TO AFFECTED AREA TOPICALLY 2 (TWO) TIMES A WEEK. (Patient not taking:  Reported on 12/15/2019) 120 mL 0   lisinopril (PRINIVIL,ZESTRIL) 10 MG tablet Take 1 tablet (10 mg total) by mouth daily. (Patient not taking: Reported on 12/15/2019) 90 tablet 3   trimethoprim-polymyxin b (POLYTRIM) ophthalmic solution Place 1 drop into both eyes every 4 (four) hours. (Patient not taking: Reported on 12/15/2019) 10 mL 0   No facility-administered medications prior to visit.    No Known Allergies  ROS Review of Systems  per hpi  Otherwise negative   Objective:    Physical Exam Constitutional:      General: He is not in acute distress.    Appearance: Normal appearance. He is normal weight. He is not ill-appearing, toxic-appearing or diaphoretic.  Cardiovascular:     Rate and Rhythm: Normal rate and regular rhythm.     Heart sounds: Normal heart sounds. No murmur heard.  No friction rub. No gallop.   Pulmonary:     Effort: Pulmonary effort is normal. No respiratory distress.     Breath sounds: No stridor. Wheezing present. No rhonchi or rales.  Chest:     Chest wall: No tenderness.  Neurological:     General: No focal deficit present.     Mental Status: He is alert and oriented to person, place, and time. Mental status is at baseline.  Psychiatric:        Mood and Affect: Mood normal.        Behavior: Behavior normal.        Thought Content: Thought content normal.        Judgment: Judgment normal.     BP (!) 187/85    Pulse 77    Temp 98 F (36.7 C) (Temporal)    Resp 18    Ht 6\' 2"  (1.88 m)    Wt 180 lb 6.4 oz (81.8 kg)    SpO2 99%    BMI 23.16 kg/m  Wt Readings from Last 3 Encounters:  12/15/19 180 lb 6.4 oz (81.8 kg)  04/10/19 179 lb 3.2 oz (81.3 kg)  12/17/18 182 lb (82.6 kg)     There are no preventive care reminders to display for this patient.  There are no preventive care reminders to display for this patient.  Lab Results  Component Value Date   TSH 0.647 04/10/2019   Lab Results  Component Value Date   WBC 6.7 05/17/2017   HGB 16.8  05/17/2017   HCT 49.3 05/17/2017   MCV 86 05/17/2017   PLT 328 05/17/2017   Lab Results  Component Value Date   NA 141 04/10/2019   K 4.4 04/10/2019   CO2 21 04/10/2019   GLUCOSE 104 (H) 04/10/2019   BUN 14  04/10/2019   CREATININE 1.08 04/10/2019   BILITOT 0.4 04/10/2019   ALKPHOS 94 04/10/2019   AST 16 04/10/2019   ALT 17 04/10/2019   PROT 6.6 04/10/2019   ALBUMIN 4.1 04/10/2019   CALCIUM 8.6 (L) 04/10/2019   Lab Results  Component Value Date   CHOL 220 (H) 05/17/2017   Lab Results  Component Value Date   HDL 47 05/17/2017   Lab Results  Component Value Date   LDLCALC 159 (H) 05/17/2017   Lab Results  Component Value Date   TRIG 69 05/17/2017   Lab Results  Component Value Date   CHOLHDL 4.7 05/17/2017   No results found for: HGBA1C    Assessment & Plan:   Problem List Items Addressed This Visit      Cardiovascular and Mediastinum   Essential hypertension   Relevant Medications   losartan-hydrochlorothiazide (HYZAAR) 100-25 MG tablet    Other Visit Diagnoses    Acute bronchitis, unspecified organism    -  Primary   Relevant Medications   predniSONE (DELTASONE) 10 MG tablet   montelukast (SINGULAIR) 10 MG tablet      Meds ordered this encounter  Medications   losartan-hydrochlorothiazide (HYZAAR) 100-25 MG tablet    Sig: Take 1 tablet by mouth daily.    Dispense:  90 tablet    Refill:  0   predniSONE (DELTASONE) 10 MG tablet    Sig: Take 3 tablets (30 mg total) by mouth daily with breakfast for 3 days, THEN 2 tablets (20 mg total) daily with breakfast for 3 days, THEN 1 tablet (10 mg total) daily with breakfast for 3 days.    Dispense:  18 tablet    Refill:  0    Order Specific Question:   Supervising Provider    Answer:   Neva Seat, JEFFREY R [[id number]]   montelukast (SINGULAIR) 10 MG tablet    Sig: Take 1 tablet (10 mg total) by mouth at bedtime.    Dispense:  30 tablet    Refill:  3    Order Specific Question:   Supervising Provider     Answer:   Neva Seat, JEFFREY R [[id number]]    Follow-up: Return in about 3 months (around 03/16/2020).   PLAN  Restart Hyzaar 100-25mg  PO qd.   After 3-4 days on Hyzaar may start prednisone taper.   singulair 10mg  Po  qhs prn for bronchitis / seasonal allergies  Return to clinic in 2-3 weeks for nurse visit bp check  Return for med check with provider in 3 months  Patient encouraged to call clinic with any questions, comments, or concerns.  , NP

## 2020-01-01 ENCOUNTER — Ambulatory Visit: Payer: PRIVATE HEALTH INSURANCE

## 2020-03-07 ENCOUNTER — Other Ambulatory Visit: Payer: Self-pay | Admitting: Registered Nurse

## 2020-03-07 DIAGNOSIS — I10 Essential (primary) hypertension: Secondary | ICD-10-CM

## 2020-03-18 ENCOUNTER — Other Ambulatory Visit: Payer: Self-pay

## 2020-03-18 ENCOUNTER — Encounter: Payer: Self-pay | Admitting: Registered Nurse

## 2020-03-18 ENCOUNTER — Ambulatory Visit: Payer: PRIVATE HEALTH INSURANCE | Admitting: Registered Nurse

## 2020-03-18 VITALS — BP 128/68 | HR 83 | Temp 98.0°F | Resp 18 | Ht 74.0 in | Wt 184.6 lb

## 2020-03-18 DIAGNOSIS — I10 Essential (primary) hypertension: Secondary | ICD-10-CM | POA: Diagnosis not present

## 2020-03-18 MED ORDER — AMLODIPINE BESYLATE 10 MG PO TABS: 10.0000 mg | ORAL_TABLET | Freq: Every day | ORAL | 3 refills | Status: DC

## 2020-03-18 MED ORDER — LOSARTAN POTASSIUM-HCTZ 100-25 MG PO TABS
1.0000 | ORAL_TABLET | Freq: Every day | ORAL | 3 refills | Status: DC
Start: 2020-03-18 — End: 2020-03-29

## 2020-03-18 NOTE — Patient Instructions (Signed)
° ° ° °  If you have lab work done today you will be contacted with your lab results within the next 2 weeks.  If you have not heard from us then please contact us. The fastest way to get your results is to register for My Chart. ° ° °IF you received an x-ray today, you will receive an invoice from [facility]. Please contact [facility] at [phone] with questions or concerns regarding your invoice.  ° °IF you received labwork today, you will receive an invoice from [facility]. Please contact [facility] at [phone] with questions or concerns regarding your invoice.  ° °Our billing staff will not be able to assist you with questions regarding bills from these companies. ° °You will be contacted with the lab results as soon as they are available. The fastest way to get your results is to activate your My Chart account. Instructions are located on the last page of this paperwork. If you have not heard from us regarding the results in 2 weeks, please contact this office. °  ° ° ° °

## 2020-03-18 NOTE — Progress Notes (Signed)
Established Patient Office Visit  Subjective:  Patient ID: Christopher Case, male    DOB: April 19, 1965  Age: 55 y.o. MRN: [id number]  CC:  Chief Complaint  Patient presents with  . Follow-up    Patient states he is here for 3 month follow up for Hypertension he has no other concerns.    HPI Christopher Case presents for htn follow up   Hypertension: Patient Currently taking: losartan-hctz 100-25mg  Po qd Good effect. No AEs. Denies CV symptoms including: chest pain, shob, doe, headache, visual changes, fatigue, claudication, and dependent edema.   Previous readings and labs: BP Readings from Last 3 Encounters:  03/18/20 128/68  12/15/19 (!) 170/82  05/19/19 (!) 141/77   Lab Results  Component Value Date   CREATININE 1.08 04/10/2019      Past Medical History:  Diagnosis Date  . Allergic rhinitis   . HTN (hypertension)     Past Surgical History:  Procedure Laterality Date  . NO PAST SURGERIES      Family History  Problem Relation Age of Onset  . Hypertension Father   . Cancer Sister   . Allergic rhinitis Neg Hx   . Angioedema Neg Hx   . Asthma Neg Hx   . Atopy Neg Hx   . Eczema Neg Hx   . Immunodeficiency Neg Hx   . Urticaria Neg Hx     Social History   Socioeconomic History  . Marital status: Married    Spouse name: Not on file  . Number of children: Not on file  . Years of education: Not on file  . Highest education level: Not on file  Occupational History  . Not on file  Tobacco Use  . Smoking status: Never Smoker  . Smokeless tobacco: Never Used  Substance and Sexual Activity  . Alcohol use: No    Alcohol/week: 0.0 standard drinks  . Drug use: No  . Sexual activity: Yes  Other Topics Concern  . Not on file  Social History Narrative  . Not on file   Social Determinants of Health   Financial Resource Strain: Not on file  Food Insecurity: Not on file  Transportation Needs: Not on file  Physical Activity: Not on file  Stress: Not on file   Social Connections: Not on file  Intimate Partner Violence: Not on file    Outpatient Medications Prior to Visit  Medication Sig Dispense Refill  . montelukast (SINGULAIR) 10 MG tablet Take 1 tablet (10 mg total) by mouth at bedtime. 30 tablet 3  . losartan-hydrochlorothiazide (HYZAAR) 100-25 MG tablet TAKE 1 TABLET BY MOUTH EVERY DAY 90 tablet 0   No facility-administered medications prior to visit.    No Known Allergies  ROS Review of Systems  Constitutional: Negative.   HENT: Negative.   Eyes: Negative.   Respiratory: Negative.   Cardiovascular: Negative.   Gastrointestinal: Negative.   Genitourinary: Negative.   Musculoskeletal: Negative.   Skin: Negative.   Neurological: Negative.   Psychiatric/Behavioral: Negative.   All other systems reviewed and are negative.     Objective:    Physical Exam Constitutional:      General: He is not in acute distress.    Appearance: Normal appearance. He is normal weight. He is not ill-appearing, toxic-appearing or diaphoretic.  Cardiovascular:     Rate and Rhythm: Normal rate and regular rhythm.     Heart sounds: Normal heart sounds. No murmur heard. No friction rub. No gallop.   Pulmonary:     Effort:  Pulmonary effort is normal. No respiratory distress.     Breath sounds: Normal breath sounds. No stridor. No wheezing, rhonchi or rales.  Chest:     Chest wall: No tenderness.  Neurological:     General: No focal deficit present.     Mental Status: He is alert and oriented to person, place, and time. Mental status is at baseline.  Psychiatric:        Mood and Affect: Mood normal.        Behavior: Behavior normal.        Thought Content: Thought content normal.        Judgment: Judgment normal.     BP (!) 181/113   Pulse 83   Temp 98 F (36.7 C) (Temporal)   Resp 18   Ht 6\' 2"  (1.88 m)   Wt 184 lb 9.6 oz (83.7 kg)   SpO2 99%   BMI 23.70 kg/m  Wt Readings from Last 3 Encounters:  03/18/20 184 lb 9.6 oz (83.7 kg)   12/15/19 180 lb 6.4 oz (81.8 kg)  04/10/19 179 lb 3.2 oz (81.3 kg)     There are no preventive care reminders to display for this patient.  There are no preventive care reminders to display for this patient.  Lab Results  Component Value Date   TSH 0.647 04/10/2019   Lab Results  Component Value Date   WBC 6.7 05/17/2017   HGB 16.8 05/17/2017   HCT 49.3 05/17/2017   MCV 86 05/17/2017   PLT 328 05/17/2017   Lab Results  Component Value Date   NA 141 04/10/2019   K 4.4 04/10/2019   CO2 21 04/10/2019   GLUCOSE 104 (H) 04/10/2019   BUN 14 04/10/2019   CREATININE 1.08 04/10/2019   BILITOT 0.4 04/10/2019   ALKPHOS 94 04/10/2019   AST 16 04/10/2019   ALT 17 04/10/2019   PROT 6.6 04/10/2019   ALBUMIN 4.1 04/10/2019   CALCIUM 8.6 (L) 04/10/2019   Lab Results  Component Value Date   CHOL 220 (H) 05/17/2017   Lab Results  Component Value Date   HDL 47 05/17/2017   Lab Results  Component Value Date   LDLCALC 159 (H) 05/17/2017   Lab Results  Component Value Date   TRIG 69 05/17/2017   Lab Results  Component Value Date   CHOLHDL 4.7 05/17/2017   No results found for: HGBA1C    Assessment & Plan:   Problem List Items Addressed This Visit      Cardiovascular and Mediastinum   Essential hypertension - Primary   Relevant Medications   losartan-hydrochlorothiazide (HYZAAR) 100-25 MG tablet   Other Relevant Orders   Comprehensive metabolic panel   CBC With Differential   Lipid panel      Meds ordered this encounter  Medications  . DISCONTD: amLODipine (NORVASC) 10 MG tablet    Sig: Take 1 tablet (10 mg total) by mouth daily.    Dispense:  90 tablet    Refill:  3    Order Specific Question:   Supervising Provider    Answer:   07/17/2017, JEFFREY R [[id number]]  . losartan-hydrochlorothiazide (HYZAAR) 100-25 MG tablet    Sig: Take 1 tablet by mouth daily.    Dispense:  90 tablet    Refill:  3    Order Specific Question:   Supervising Provider    Answer:    Neva Seat, JEFFREY R [[id number]]    Follow-up: No follow-ups on file.   PLAN  Pt admits  he was stressed and rushing to get here - BP reduced by end of visit. WNL. Continue current regimen.  Labs collected. Will follow up with the patient as warranted.  Patient encouraged to call clinic with any questions, comments, or concerns.  Janeece Agee, NP

## 2020-03-19 LAB — CBC WITH DIFFERENTIAL
Basophils Absolute: 0.1 10*3/uL (ref 0.0–0.2)
Basos: 2 %
EOS (ABSOLUTE): 0.4 10*3/uL (ref 0.0–0.4)
Eos: 7 %
Hematocrit: 48.8 % (ref 37.5–51.0)
Hemoglobin: 16.5 g/dL (ref 13.0–17.7)
Immature Grans (Abs): 0 10*3/uL (ref 0.0–0.1)
Immature Granulocytes: 0 %
Lymphocytes Absolute: 1.5 10*3/uL (ref 0.7–3.1)
Lymphs: 25 %
MCH: 29.6 pg (ref 26.6–33.0)
MCHC: 33.8 g/dL (ref 31.5–35.7)
MCV: 88 fL (ref 79–97)
Monocytes Absolute: 0.4 10*3/uL (ref 0.1–0.9)
Monocytes: 6 %
Neutrophils Absolute: 3.7 10*3/uL (ref 1.4–7.0)
Neutrophils: 60 %
RBC: 5.58 x10E6/uL (ref 4.14–5.80)
RDW: 12.9 % (ref 11.6–15.4)
WBC: 6.1 10*3/uL (ref 3.4–10.8)

## 2020-03-19 LAB — COMPREHENSIVE METABOLIC PANEL
ALT: 24 IU/L (ref 0–44)
AST: 23 IU/L (ref 0–40)
Albumin/Globulin Ratio: 1.5 (ref 1.2–2.2)
Albumin: 4.4 g/dL (ref 3.8–4.9)
Alkaline Phosphatase: 80 IU/L (ref 44–121)
BUN/Creatinine Ratio: 21 — ABNORMAL HIGH (ref 9–20)
BUN: 25 mg/dL — ABNORMAL HIGH (ref 6–24)
Bilirubin Total: 0.4 mg/dL (ref 0.0–1.2)
CO2: 21 mmol/L (ref 20–29)
Calcium: 9.3 mg/dL (ref 8.7–10.2)
Chloride: 103 mmol/L (ref 96–106)
Creatinine, Ser: 1.19 mg/dL (ref 0.76–1.27)
GFR calc Af Amer: 80 mL/min/{1.73_m2} (ref >59)
GFR calc non Af Amer: 69 mL/min/{1.73_m2} (ref >59)
Globulin, Total: 2.9 g/dL (ref 1.5–4.5)
Glucose: 119 mg/dL — ABNORMAL HIGH (ref 65–99)
Potassium: 4.1 mmol/L (ref 3.5–5.2)
Sodium: 142 mmol/L (ref 134–144)
Total Protein: 7.3 g/dL (ref 6.0–8.5)

## 2020-03-19 LAB — LIPID PANEL
Chol/HDL Ratio: 5.3 ratio — ABNORMAL HIGH (ref 0.0–5.0)
Cholesterol, Total: 202 mg/dL — ABNORMAL HIGH (ref 100–199)
HDL: 38 mg/dL — ABNORMAL LOW (ref >39)
LDL Chol Calc (NIH): 131 mg/dL — ABNORMAL HIGH (ref 0–99)
Triglycerides: 182 mg/dL — ABNORMAL HIGH (ref 0–149)
VLDL Cholesterol Cal: 33 mg/dL (ref 5–40)

## 2020-03-21 ENCOUNTER — Encounter: Payer: Self-pay | Admitting: Registered Nurse

## 2020-03-21 ENCOUNTER — Other Ambulatory Visit: Payer: Self-pay | Admitting: Registered Nurse

## 2020-03-21 DIAGNOSIS — E782 Mixed hyperlipidemia: Secondary | ICD-10-CM

## 2020-03-21 MED ORDER — SIMVASTATIN 20 MG PO TABS
20.0000 mg | ORAL_TABLET | Freq: Every day | ORAL | 3 refills | Status: DC
Start: 2020-03-21 — End: 2021-06-28

## 2020-03-25 ENCOUNTER — Telehealth: Payer: Self-pay | Admitting: Registered Nurse

## 2020-03-25 NOTE — Telephone Encounter (Signed)
Patient's spouse called to clarify 3 prescriptions currently at pharmacy. Spouse stated that the provider only prescribed 2 meds during his last visit which were    losartan-hydrochlorothiazide (HYZAAR) 100-25 MG tablet [[id number]]   And   simvastatin (ZOCOR) 20 MG tablet [[id number]]   Pharmacy has a 3rd Rx for   amLODipine (NORVASC) 10 MG tablet [[id number]] DISCONTINUED   Patient needs to pick up medications today and wants to confirm what he's supposed to be taking. Please advise at [phone].

## 2020-03-28 NOTE — Telephone Encounter (Signed)
I see where Kateri Plummer send in the Simvastatin and the losartan-hydrochlorothiazide. Was the pt needing the Amlodipine as well? Please advise

## 2020-03-29 ENCOUNTER — Other Ambulatory Visit: Payer: Self-pay

## 2020-03-29 DIAGNOSIS — I10 Essential (primary) hypertension: Secondary | ICD-10-CM

## 2020-03-29 MED ORDER — LOSARTAN POTASSIUM-HCTZ 100-25 MG PO TABS: 1.0000 | ORAL_TABLET | Freq: Every day | ORAL | 3 refills | Status: DC

## 2020-03-29 NOTE — Telephone Encounter (Signed)
Called and spoke with the patient. The medication was resent to the Pharmacy.

## 2020-04-05 ENCOUNTER — Other Ambulatory Visit: Payer: Self-pay

## 2020-04-05 ENCOUNTER — Telehealth: Payer: Self-pay

## 2020-04-05 ENCOUNTER — Other Ambulatory Visit: Payer: Self-pay | Admitting: Registered Nurse

## 2020-04-05 DIAGNOSIS — I10 Essential (primary) hypertension: Secondary | ICD-10-CM

## 2020-04-05 MED ORDER — LOSARTAN POTASSIUM 100 MG PO TABS: 100.0000 mg | ORAL_TABLET | Freq: Every day | ORAL | 0 refills | Status: DC

## 2020-04-05 MED ORDER — HYDROCHLOROTHIAZIDE 25 MG PO TABS: 25.0000 mg | ORAL_TABLET | Freq: Every day | ORAL | 0 refills | Status: DC

## 2020-04-05 MED ORDER — LOSARTAN POTASSIUM 50 MG PO TABS: 100.0000 mg | ORAL_TABLET | Freq: Every day | ORAL | 3 refills | Status: DC

## 2020-04-05 NOTE — Progress Notes (Signed)
Shortage on combination losartan-hctz Will send each separately - 90 day supply. Ok to resume combination when available and stop separate meds.  Jari Sportsman, NP

## 2020-04-05 NOTE — Telephone Encounter (Signed)
Have sent separate losartan and hctz to pharmacy. Should be fine to hold him over until combo available.   Thanks,  Luan Pulling

## 2020-04-05 NOTE — Telephone Encounter (Signed)
Pt came into the office this morning concerned as he was unable to get his losartan-HCTZ from the pharmacy due to shortage is there an alternative he can take in the interim?   Advised pt to take Amlodipine as prescribed and monitor his BP and let us know if any high readings in the meantime.

## 2020-04-05 NOTE — Telephone Encounter (Signed)
100 mg is back ordered pharmacy had some 50 sent Rx for 1 50 mg pills daily so he can continue treatment, called and informed pt wife Rx available

## 2020-09-16 ENCOUNTER — Ambulatory Visit: Payer: Self-pay | Admitting: Registered Nurse

## 2021-05-12 ENCOUNTER — Other Ambulatory Visit: Payer: Self-pay | Admitting: Registered Nurse

## 2021-05-12 DIAGNOSIS — I10 Essential (primary) hypertension: Secondary | ICD-10-CM

## 2021-05-12 DIAGNOSIS — E782 Mixed hyperlipidemia: Secondary | ICD-10-CM

## 2021-06-20 ENCOUNTER — Other Ambulatory Visit: Payer: Self-pay | Admitting: Registered Nurse

## 2021-06-20 DIAGNOSIS — I10 Essential (primary) hypertension: Secondary | ICD-10-CM

## 2021-06-21 ENCOUNTER — Ambulatory Visit: Payer: Self-pay

## 2021-06-21 ENCOUNTER — Inpatient Hospital Stay
Admission: EM | Admit: 2021-06-21 | Discharge: 2021-06-21 | Disposition: A | Discharge status: Home / Self Care | Payer: Self-pay | Attending: Registered Nurse | Admitting: Registered Nurse

## 2021-06-21 NOTE — ED Provider Notes (Signed)
Note opened in error. Patient was not seen due to his insurance. ?  ?Wallis Bamberg, PA-C ?06/21/21 1624 ? ?

## 2021-06-28 ENCOUNTER — Ambulatory Visit (INDEPENDENT_AMBULATORY_CARE_PROVIDER_SITE_OTHER): Payer: Self-pay | Admitting: Family Medicine

## 2021-06-28 ENCOUNTER — Encounter: Payer: Self-pay | Admitting: Family Medicine

## 2021-06-28 VITALS — BP 159/75 | HR 66 | Ht 74.0 in | Wt 179.0 lb

## 2021-06-28 DIAGNOSIS — Z7689 Persons encountering health services in other specified circumstances: Secondary | ICD-10-CM

## 2021-06-28 DIAGNOSIS — L84 Corns and callosities: Secondary | ICD-10-CM

## 2021-06-28 DIAGNOSIS — I10 Essential (primary) hypertension: Secondary | ICD-10-CM

## 2021-06-28 DIAGNOSIS — E782 Mixed hyperlipidemia: Secondary | ICD-10-CM

## 2021-06-28 DIAGNOSIS — Z1159 Encounter for screening for other viral diseases: Secondary | ICD-10-CM

## 2021-06-28 MED ORDER — LOSARTAN POTASSIUM-HCTZ 100-25 MG PO TABS: 1.0000 | ORAL_TABLET | Freq: Every day | ORAL | 1 refills | Status: DC

## 2021-06-28 MED ORDER — AMLODIPINE BESYLATE 10 MG PO TABS: 10.0000 mg | ORAL_TABLET | Freq: Every day | ORAL | 1 refills | Status: DC

## 2021-06-28 MED ORDER — SIMVASTATIN 20 MG PO TABS: 20.0000 mg | ORAL_TABLET | Freq: Every day | ORAL | 3 refills | Status: AC

## 2021-06-28 NOTE — Progress Notes (Signed)
    SUBJECTIVE:   CHIEF COMPLAINT / HPI:  Chief Complaint  Patient presents with   Establish Care    Previously was seeing Janeece Agee, NP at [facility] urgent care until practice close down.  Lives with wife and 3 kids. Exercises regularly at the gym, walking. Denies smoking history, alcohol use. Enjoys outdoor activities. He works at [employer].  Patient reports he has a callus on the bottom of his foot and is wondering what he can do about this.  He states he has changed shoes and it has been improving.  He tried Vaseline 1 time for this.  HTN He takes amlodipine 10 mg, losartan-HCTZ 100-25 mg.  He has brought in his medications today.  He has not been checking his blood pressure regularly.  He does not have a BP cuff at home but does have capability to check it at work.  PERTINENT  PMH / PSH: HTN, HLD  Patient Care Team: Littie Deeds, MD as PCP - General (Family Medicine)   OBJECTIVE:   BP (!) 159/75   Pulse 66   Ht 6\' 2"  (1.88 m)   Wt 179 lb (81.2 kg)   SpO2 98%   BMI 22.98 kg/m   Physical Exam Constitutional:      General: He is not in acute distress. Cardiovascular:     Rate and Rhythm: Normal rate and regular rhythm.  Pulmonary:     Effort: Pulmonary effort is normal. No respiratory distress.     Breath sounds: Normal breath sounds.  Skin:    Comments: Plantar surface of foot with hyperkeratotic thickened papule.  Neurological:     Mental Status: He is alert.        06/28/2021    4:08 PM  Depression screen PHQ 2/9  Decreased Interest 1  Down, Depressed, Hopeless 0  PHQ - 2 Score 1  Altered sleeping 0  Tired, decreased energy 1  Change in appetite 0  Feeling bad or failure about yourself  0  Trouble concentrating 0  Moving slowly or fidgety/restless 0  Suicidal thoughts 0  PHQ-9 Score 2     {Show previous vital signs (optional):23777}    ASSESSMENT/PLAN:   Encounter to establish care  Mixed hyperlipidemia On statin, check lipid  panel  Essential hypertension Not at goal.  He is on 3 different antihypertensives. - check BMP - medications refilled today - follow-up 1 month, advised to keep BP log - consider ambulatory BP monitoring if remains elevated   Corn of foot Advised to apply Vaseline at least every other day. - consider urea-based cream if not improving  HCM - HCV screening ordered - discuss shingles vaccine at follow-up  Future lab orders placed as unable to be seen today due to lab closure  Return in about 4 weeks (around 07/26/2021) for f/u HTN.   07/28/2021, MD [facility]

## 2021-06-28 NOTE — Patient Instructions (Addendum)
It was nice seeing you today!  Try using Vaseline every day or at least every other day.  Stay well, Littie Deeds, MD [facility] [phone]  --  Make sure to check out at the front desk before you leave today.  Please arrive at least 15 minutes prior to your scheduled appointments.  If you had blood work today, I will send you a MyChart message or a letter if results are normal. Otherwise, I will give you a call.  If you had a referral placed, they will call you to set up an appointment. Please give [place] a call if you don't hear back in the next 2 weeks.  If you need additional refills before your next appointment, please call your pharmacy first.

## 2021-06-29 ENCOUNTER — Other Ambulatory Visit: Payer: Self-pay

## 2021-06-29 DIAGNOSIS — E782 Mixed hyperlipidemia: Secondary | ICD-10-CM | POA: Insufficient documentation

## 2021-06-29 DIAGNOSIS — Z1159 Encounter for screening for other viral diseases: Secondary | ICD-10-CM

## 2021-06-29 DIAGNOSIS — I10 Essential (primary) hypertension: Secondary | ICD-10-CM

## 2021-06-29 NOTE — Assessment & Plan Note (Signed)
On statin, check lipid panel 

## 2021-06-29 NOTE — Assessment & Plan Note (Signed)
Not at goal.  He is on 3 different antihypertensives. - check BMP - medications refilled today - follow-up 1 month, advised to keep BP log - consider ambulatory BP monitoring if remains elevated

## 2021-06-30 ENCOUNTER — Encounter: Payer: Self-pay | Admitting: Family Medicine

## 2021-06-30 LAB — LIPID PANEL
Chol/HDL Ratio: 4.5 ratio (ref 0.0–5.0)
Cholesterol, Total: 174 mg/dL (ref 100–199)
HDL: 39 mg/dL — ABNORMAL LOW (ref >39)
LDL Chol Calc (NIH): 114 mg/dL — ABNORMAL HIGH (ref 0–99)
Triglycerides: 117 mg/dL (ref 0–149)
VLDL Cholesterol Cal: 21 mg/dL (ref 5–40)

## 2021-06-30 LAB — HCV AB W REFLEX TO QUANT PCR: HCV Ab: NONREACTIVE

## 2021-06-30 LAB — BASIC METABOLIC PANEL
BUN/Creatinine Ratio: 16 (ref 9–20)
BUN: 19 mg/dL (ref 6–24)
CO2: 24 mmol/L (ref 20–29)
Calcium: 9.2 mg/dL (ref 8.7–10.2)
Chloride: 102 mmol/L (ref 96–106)
Creatinine, Ser: 1.22 mg/dL (ref 0.76–1.27)
Glucose: 108 mg/dL — ABNORMAL HIGH (ref 70–99)
Potassium: 4.2 mmol/L (ref 3.5–5.2)
Sodium: 141 mmol/L (ref 134–144)
eGFR: 70 mL/min/{1.73_m2} (ref >59)

## 2021-06-30 LAB — HCV INTERPRETATION

## 2022-01-07 ENCOUNTER — Other Ambulatory Visit: Payer: Self-pay | Admitting: Family Medicine

## 2022-01-07 DIAGNOSIS — I10 Essential (primary) hypertension: Secondary | ICD-10-CM

## 2022-02-12 ENCOUNTER — Other Ambulatory Visit: Payer: Self-pay | Admitting: Family Medicine

## 2022-02-12 DIAGNOSIS — I10 Essential (primary) hypertension: Secondary | ICD-10-CM

## 2022-03-19 ENCOUNTER — Other Ambulatory Visit: Payer: Self-pay | Admitting: Family Medicine

## 2022-03-19 DIAGNOSIS — I10 Essential (primary) hypertension: Secondary | ICD-10-CM

## 2022-04-23 ENCOUNTER — Other Ambulatory Visit: Payer: Self-pay | Admitting: Family Medicine

## 2022-04-23 DIAGNOSIS — I10 Essential (primary) hypertension: Secondary | ICD-10-CM

## 2022-04-29 ENCOUNTER — Other Ambulatory Visit: Payer: Self-pay | Admitting: Family Medicine

## 2022-04-29 DIAGNOSIS — I10 Essential (primary) hypertension: Secondary | ICD-10-CM
